# Patient Record
Sex: Female | Born: 1980 | Race: White | Hispanic: No | Marital: Married | Smoking: Never smoker
Health system: Southern US, Community
[De-identification: ages and names within clinical notes are randomized; demographics above are authoritative.]

## PROBLEM LIST (undated history)

## (undated) DIAGNOSIS — I10 Essential (primary) hypertension: Secondary | ICD-10-CM

## (undated) DIAGNOSIS — D72829 Elevated white blood cell count, unspecified: Secondary | ICD-10-CM

## (undated) DIAGNOSIS — E7800 Pure hypercholesterolemia, unspecified: Secondary | ICD-10-CM

## (undated) DIAGNOSIS — R002 Palpitations: Secondary | ICD-10-CM

## (undated) DIAGNOSIS — F411 Generalized anxiety disorder: Secondary | ICD-10-CM

## (undated) DIAGNOSIS — R079 Chest pain, unspecified: Secondary | ICD-10-CM

## (undated) DIAGNOSIS — I498 Other specified cardiac arrhythmias: Secondary | ICD-10-CM

## (undated) DIAGNOSIS — E663 Overweight: Secondary | ICD-10-CM

## (undated) HISTORY — DX: Chest pain, unspecified: R07.9

## (undated) HISTORY — DX: Overweight: E66.3

## (undated) HISTORY — DX: Other specified cardiac arrhythmias: I49.8

## (undated) HISTORY — DX: Palpitations: R00.2

## (undated) HISTORY — DX: Elevated white blood cell count, unspecified: D72.829

## (undated) HISTORY — DX: Generalized anxiety disorder: F41.1

## (undated) HISTORY — DX: Pure hypercholesterolemia, unspecified: E78.00

## (undated) HISTORY — PX: JOINT REPLACEMENT: SHX530

---

## 1999-05-21 ENCOUNTER — Emergency Department (HOSPITAL_COMMUNITY): Admission: EM | Admit: 1999-05-21 | Discharge: 1999-05-21 | Payer: Self-pay | Admitting: Emergency Medicine

## 2000-05-13 ENCOUNTER — Other Ambulatory Visit: Admission: RE | Admit: 2000-05-13 | Discharge: 2000-05-13 | Payer: Self-pay | Admitting: Obstetrics and Gynecology

## 2001-08-03 ENCOUNTER — Other Ambulatory Visit: Admission: RE | Admit: 2001-08-03 | Discharge: 2001-08-03 | Payer: Self-pay | Admitting: Obstetrics and Gynecology

## 2001-08-10 ENCOUNTER — Encounter: Payer: Self-pay | Admitting: Family Medicine

## 2001-08-10 ENCOUNTER — Ambulatory Visit (HOSPITAL_COMMUNITY): Admission: RE | Admit: 2001-08-10 | Discharge: 2001-08-10 | Payer: Self-pay | Admitting: Family Medicine

## 2003-03-29 ENCOUNTER — Other Ambulatory Visit: Admission: RE | Admit: 2003-03-29 | Discharge: 2003-03-29 | Payer: Self-pay | Admitting: Obstetrics and Gynecology

## 2003-08-14 ENCOUNTER — Encounter: Admission: RE | Admit: 2003-08-14 | Discharge: 2003-08-14 | Payer: Self-pay | Admitting: Obstetrics and Gynecology

## 2004-03-17 ENCOUNTER — Emergency Department (HOSPITAL_COMMUNITY): Admission: EM | Admit: 2004-03-17 | Discharge: 2004-03-17 | Payer: Self-pay | Admitting: Emergency Medicine

## 2004-04-14 ENCOUNTER — Ambulatory Visit: Payer: Self-pay | Admitting: Cardiovascular Disease

## 2004-05-15 ENCOUNTER — Ambulatory Visit: Payer: Self-pay | Admitting: Cardiovascular Disease

## 2004-05-15 ENCOUNTER — Ambulatory Visit: Payer: Self-pay

## 2004-10-09 ENCOUNTER — Emergency Department (HOSPITAL_COMMUNITY): Admission: EM | Admit: 2004-10-09 | Discharge: 2004-10-09 | Payer: Self-pay | Admitting: *Deleted

## 2004-10-09 ENCOUNTER — Other Ambulatory Visit: Admission: RE | Admit: 2004-10-09 | Discharge: 2004-10-09 | Payer: Self-pay | Admitting: Obstetrics and Gynecology

## 2004-10-14 ENCOUNTER — Ambulatory Visit (HOSPITAL_COMMUNITY): Admission: RE | Admit: 2004-10-14 | Discharge: 2004-10-14 | Payer: Self-pay | Admitting: Orthopedic Surgery

## 2004-10-27 ENCOUNTER — Encounter: Admission: RE | Admit: 2004-10-27 | Discharge: 2005-01-25 | Payer: Self-pay | Admitting: Specialist

## 2004-11-11 ENCOUNTER — Ambulatory Visit (HOSPITAL_COMMUNITY): Admission: RE | Admit: 2004-11-11 | Discharge: 2004-11-11 | Payer: Self-pay | Admitting: Specialist

## 2004-11-11 ENCOUNTER — Ambulatory Visit (HOSPITAL_BASED_OUTPATIENT_CLINIC_OR_DEPARTMENT_OTHER): Admission: RE | Admit: 2004-11-11 | Discharge: 2004-11-12 | Payer: Self-pay | Admitting: Specialist

## 2004-11-20 ENCOUNTER — Ambulatory Visit: Admission: RE | Admit: 2004-11-20 | Discharge: 2004-11-20 | Payer: Self-pay | Admitting: Specialist

## 2005-01-26 ENCOUNTER — Ambulatory Visit (HOSPITAL_COMMUNITY): Admission: RE | Admit: 2005-01-26 | Discharge: 2005-01-26 | Payer: Self-pay | Admitting: Family Medicine

## 2005-03-29 ENCOUNTER — Emergency Department (HOSPITAL_COMMUNITY): Admission: EM | Admit: 2005-03-29 | Discharge: 2005-03-29 | Payer: Self-pay | Admitting: Emergency Medicine

## 2005-05-04 ENCOUNTER — Ambulatory Visit: Payer: Self-pay | Admitting: Cardiovascular Disease

## 2005-07-13 ENCOUNTER — Ambulatory Visit (HOSPITAL_COMMUNITY): Admission: RE | Admit: 2005-07-13 | Discharge: 2005-07-13 | Payer: Self-pay | Admitting: Family Medicine

## 2005-07-13 ENCOUNTER — Encounter: Payer: Self-pay | Admitting: Vascular Surgery

## 2005-09-09 ENCOUNTER — Ambulatory Visit: Payer: Self-pay | Admitting: Hematology and Oncology

## 2005-09-16 ENCOUNTER — Other Ambulatory Visit: Admission: RE | Admit: 2005-09-16 | Discharge: 2005-09-16 | Payer: Self-pay | Admitting: Hematology and Oncology

## 2005-09-16 ENCOUNTER — Encounter: Payer: Self-pay | Admitting: Hematology and Oncology

## 2005-09-16 LAB — CBC & DIFF AND RETIC
EOS%: 1.5 % (ref 0.0–7.0)
MCH: 32.4 pg (ref 26.0–34.0)
MCHC: 35.3 g/dL (ref 32.0–36.0)
MCV: 91.8 fL (ref 81.0–101.0)
MONO%: 4.9 % (ref 0.0–13.0)
RBC: 3.67 10*6/uL — ABNORMAL LOW (ref 3.70–5.32)
RDW: 12.7 % (ref 11.3–14.5)
RETIC #: 73.4 10*3/uL (ref 19.7–115.1)
Retic %: 2 % (ref 0.4–2.3)

## 2005-09-21 LAB — COMPREHENSIVE METABOLIC PANEL
ALT: 26 U/L (ref 0–40)
Albumin: 4.1 g/dL (ref 3.5–5.2)
BUN: 6 mg/dL (ref 6–23)
CO2: 22 mEq/L (ref 19–32)
Calcium: 9.2 mg/dL (ref 8.4–10.5)
Chloride: 104 mEq/L (ref 96–112)
Creatinine, Ser: 0.67 mg/dL (ref 0.40–1.20)
Potassium: 3.7 mEq/L (ref 3.5–5.3)

## 2005-09-21 LAB — HEPATITIS B CORE ANTIBODY, IGM: Hep B C IgM: NEGATIVE

## 2005-09-21 LAB — ANA: Anti Nuclear Antibody(ANA): NEGATIVE

## 2005-09-21 LAB — PROTEIN ELECTROPHORESIS, SERUM
Alpha-1-Globulin: 5.2 % — ABNORMAL HIGH (ref 2.9–4.9)
Alpha-2-Globulin: 11 % (ref 7.1–11.8)
Total Protein, Serum Electrophoresis: 6.7 g/dL (ref 6.0–8.3)

## 2005-09-21 LAB — HEPATITIS B SURFACE ANTIBODY,QUALITATIVE: Hep B S Ab: POSITIVE — AB

## 2005-09-21 LAB — HEPATITIS A ANTIBODY, TOTAL: Hep A Total Ab: NEGATIVE

## 2005-10-17 ENCOUNTER — Emergency Department (HOSPITAL_COMMUNITY): Admission: EM | Admit: 2005-10-17 | Discharge: 2005-10-17 | Payer: Self-pay | Admitting: Emergency Medicine

## 2005-10-22 ENCOUNTER — Emergency Department (HOSPITAL_COMMUNITY): Admission: EM | Admit: 2005-10-22 | Discharge: 2005-10-22 | Payer: Self-pay | Admitting: Emergency Medicine

## 2005-10-26 ENCOUNTER — Ambulatory Visit: Payer: Self-pay | Admitting: Cardiovascular Disease

## 2005-10-29 ENCOUNTER — Ambulatory Visit: Payer: Self-pay | Admitting: Cardiology

## 2005-10-29 ENCOUNTER — Encounter: Payer: Self-pay | Admitting: Cardiology

## 2005-10-29 ENCOUNTER — Ambulatory Visit (HOSPITAL_COMMUNITY): Admission: RE | Admit: 2005-10-29 | Discharge: 2005-10-29 | Payer: Self-pay | Admitting: Cardiovascular Disease

## 2005-12-11 ENCOUNTER — Inpatient Hospital Stay (HOSPITAL_COMMUNITY): Admission: AD | Admit: 2005-12-11 | Discharge: 2005-12-11 | Payer: Self-pay | Admitting: Obstetrics and Gynecology

## 2005-12-24 ENCOUNTER — Inpatient Hospital Stay (HOSPITAL_COMMUNITY): Admission: AD | Admit: 2005-12-24 | Discharge: 2005-12-24 | Payer: Self-pay | Admitting: Obstetrics and Gynecology

## 2006-01-07 ENCOUNTER — Ambulatory Visit: Payer: Self-pay | Admitting: Cardiovascular Disease

## 2006-01-08 ENCOUNTER — Ambulatory Visit: Payer: Self-pay

## 2006-01-26 ENCOUNTER — Inpatient Hospital Stay (HOSPITAL_COMMUNITY): Admission: AD | Admit: 2006-01-26 | Discharge: 2006-01-26 | Payer: Self-pay | Admitting: Obstetrics and Gynecology

## 2006-02-21 ENCOUNTER — Inpatient Hospital Stay (HOSPITAL_COMMUNITY): Admission: AD | Admit: 2006-02-21 | Discharge: 2006-02-21 | Payer: Self-pay | Admitting: Obstetrics and Gynecology

## 2006-02-22 ENCOUNTER — Inpatient Hospital Stay (HOSPITAL_COMMUNITY): Admission: AD | Admit: 2006-02-22 | Discharge: 2006-02-22 | Payer: Self-pay | Admitting: Obstetrics and Gynecology

## 2006-03-11 ENCOUNTER — Inpatient Hospital Stay (HOSPITAL_COMMUNITY): Admission: RE | Admit: 2006-03-11 | Discharge: 2006-03-14 | Payer: Self-pay | Admitting: Obstetrics and Gynecology

## 2006-04-05 ENCOUNTER — Ambulatory Visit: Payer: Self-pay | Admitting: Cardiovascular Disease

## 2006-04-23 ENCOUNTER — Ambulatory Visit: Payer: Self-pay

## 2006-04-23 ENCOUNTER — Encounter: Payer: Self-pay | Admitting: Cardiology

## 2006-07-07 ENCOUNTER — Ambulatory Visit: Payer: Self-pay | Admitting: Cardiology

## 2006-07-15 ENCOUNTER — Ambulatory Visit: Payer: Self-pay | Admitting: Cardiovascular Disease

## 2006-07-16 ENCOUNTER — Encounter: Payer: Self-pay | Admitting: Cardiovascular Disease

## 2006-07-16 ENCOUNTER — Ambulatory Visit: Payer: Self-pay

## 2006-08-12 ENCOUNTER — Emergency Department (HOSPITAL_COMMUNITY): Admission: EM | Admit: 2006-08-12 | Discharge: 2006-08-12 | Payer: Self-pay | Admitting: *Deleted

## 2006-08-13 ENCOUNTER — Emergency Department (HOSPITAL_COMMUNITY): Admission: EM | Admit: 2006-08-13 | Discharge: 2006-08-13 | Payer: Self-pay | Admitting: Emergency Medicine

## 2006-08-13 ENCOUNTER — Ambulatory Visit: Payer: Self-pay | Admitting: Cardiology

## 2006-08-23 ENCOUNTER — Ambulatory Visit (HOSPITAL_COMMUNITY): Admission: RE | Admit: 2006-08-23 | Discharge: 2006-08-23 | Payer: Self-pay | Admitting: Family Medicine

## 2006-08-25 ENCOUNTER — Ambulatory Visit: Payer: Self-pay | Admitting: Cardiovascular Disease

## 2006-08-31 ENCOUNTER — Emergency Department (HOSPITAL_COMMUNITY): Admission: EM | Admit: 2006-08-31 | Discharge: 2006-08-31 | Payer: Self-pay | Admitting: *Deleted

## 2006-08-31 ENCOUNTER — Ambulatory Visit: Payer: Self-pay | Admitting: Cardiology

## 2006-12-08 ENCOUNTER — Emergency Department (HOSPITAL_COMMUNITY): Admission: EM | Admit: 2006-12-08 | Discharge: 2006-12-08 | Payer: Self-pay | Admitting: Emergency Medicine

## 2007-06-27 ENCOUNTER — Encounter (INDEPENDENT_AMBULATORY_CARE_PROVIDER_SITE_OTHER): Payer: Self-pay | Admitting: Emergency Medicine

## 2007-06-27 ENCOUNTER — Emergency Department (HOSPITAL_COMMUNITY): Admission: EM | Admit: 2007-06-27 | Discharge: 2007-06-27 | Payer: Self-pay | Admitting: Emergency Medicine

## 2007-06-27 ENCOUNTER — Ambulatory Visit: Payer: Self-pay | Admitting: Vascular Surgery

## 2007-06-30 ENCOUNTER — Emergency Department (HOSPITAL_COMMUNITY): Admission: EM | Admit: 2007-06-30 | Discharge: 2007-07-01 | Payer: Self-pay | Admitting: Emergency Medicine

## 2008-02-13 ENCOUNTER — Emergency Department (HOSPITAL_COMMUNITY): Admission: EM | Admit: 2008-02-13 | Discharge: 2008-02-13 | Payer: Self-pay | Admitting: Emergency Medicine

## 2008-03-08 ENCOUNTER — Inpatient Hospital Stay (HOSPITAL_COMMUNITY): Admission: RE | Admit: 2008-03-08 | Discharge: 2008-03-10 | Payer: Self-pay | Admitting: Obstetrics and Gynecology

## 2008-04-27 ENCOUNTER — Telehealth: Payer: Self-pay | Admitting: Cardiovascular Disease

## 2008-05-07 DIAGNOSIS — I498 Other specified cardiac arrhythmias: Secondary | ICD-10-CM

## 2008-05-07 DIAGNOSIS — F411 Generalized anxiety disorder: Secondary | ICD-10-CM | POA: Insufficient documentation

## 2008-05-07 DIAGNOSIS — R079 Chest pain, unspecified: Secondary | ICD-10-CM | POA: Insufficient documentation

## 2008-05-07 DIAGNOSIS — R002 Palpitations: Secondary | ICD-10-CM

## 2008-05-07 DIAGNOSIS — D72829 Elevated white blood cell count, unspecified: Secondary | ICD-10-CM | POA: Insufficient documentation

## 2008-05-07 DIAGNOSIS — E7800 Pure hypercholesterolemia, unspecified: Secondary | ICD-10-CM | POA: Insufficient documentation

## 2008-07-03 ENCOUNTER — Telehealth: Payer: Self-pay | Admitting: Cardiovascular Disease

## 2008-07-03 ENCOUNTER — Emergency Department (HOSPITAL_COMMUNITY): Admission: EM | Admit: 2008-07-03 | Discharge: 2008-07-03 | Payer: Self-pay | Admitting: Emergency Medicine

## 2008-07-20 ENCOUNTER — Telehealth: Payer: Self-pay | Admitting: Cardiovascular Disease

## 2008-07-23 ENCOUNTER — Ambulatory Visit: Payer: Self-pay | Admitting: Cardiovascular Disease

## 2008-07-23 DIAGNOSIS — I10 Essential (primary) hypertension: Secondary | ICD-10-CM

## 2008-09-11 ENCOUNTER — Ambulatory Visit: Payer: Self-pay

## 2008-09-11 ENCOUNTER — Encounter: Payer: Self-pay | Admitting: Cardiovascular Disease

## 2008-09-23 ENCOUNTER — Emergency Department (HOSPITAL_COMMUNITY): Admission: EM | Admit: 2008-09-23 | Discharge: 2008-09-23 | Payer: Self-pay | Admitting: Emergency Medicine

## 2008-09-29 ENCOUNTER — Emergency Department (HOSPITAL_COMMUNITY): Admission: EM | Admit: 2008-09-29 | Discharge: 2008-09-29 | Payer: Self-pay | Admitting: Emergency Medicine

## 2008-09-30 ENCOUNTER — Encounter (INDEPENDENT_AMBULATORY_CARE_PROVIDER_SITE_OTHER): Payer: Self-pay | Admitting: Emergency Medicine

## 2008-09-30 ENCOUNTER — Ambulatory Visit: Payer: Self-pay | Admitting: Vascular Surgery

## 2008-09-30 ENCOUNTER — Emergency Department (HOSPITAL_COMMUNITY): Admission: EM | Admit: 2008-09-30 | Discharge: 2008-09-30 | Payer: Self-pay | Admitting: Emergency Medicine

## 2008-12-11 ENCOUNTER — Emergency Department (HOSPITAL_COMMUNITY): Admission: EM | Admit: 2008-12-11 | Discharge: 2008-12-12 | Payer: Self-pay | Admitting: Emergency Medicine

## 2008-12-31 ENCOUNTER — Telehealth: Payer: Self-pay | Admitting: Cardiovascular Disease

## 2009-01-01 ENCOUNTER — Ambulatory Visit: Payer: Self-pay | Admitting: Cardiovascular Disease

## 2009-01-01 DIAGNOSIS — E663 Overweight: Secondary | ICD-10-CM | POA: Insufficient documentation

## 2009-03-01 ENCOUNTER — Encounter: Payer: Self-pay | Admitting: Cardiovascular Disease

## 2009-03-04 ENCOUNTER — Telehealth: Payer: Self-pay | Admitting: Cardiovascular Disease

## 2009-04-02 ENCOUNTER — Emergency Department (HOSPITAL_BASED_OUTPATIENT_CLINIC_OR_DEPARTMENT_OTHER): Admission: EM | Admit: 2009-04-02 | Discharge: 2009-04-02 | Payer: Self-pay | Admitting: Emergency Medicine

## 2009-04-02 ENCOUNTER — Ambulatory Visit: Payer: Self-pay | Admitting: Radiology

## 2010-01-26 ENCOUNTER — Encounter: Payer: Self-pay | Admitting: Cardiovascular Disease

## 2010-01-26 ENCOUNTER — Encounter: Payer: Self-pay | Admitting: Emergency Medicine

## 2010-01-26 ENCOUNTER — Encounter: Payer: Self-pay | Admitting: Family Medicine

## 2010-02-04 NOTE — Progress Notes (Signed)
Summary: **Look for EKG** QUESTION ABOUT EKG  Phone Note Call from Patient Call back at Home Phone [phone]   Caller: Patient Summary of Call: PT HAVE QUESTION ABOUT A EKG. Initial call taken by: Judie Grieve,  March 04, 2009 9:18 AM  Follow-up for Phone Call        Pt went to [facility] with CP and had an EKG done in the ER. She said she had a bad experience and they were super busy and she feels they let her go and maybe should not have. She said the MD there told her that her T waves were "flipped." He then told her all of the bad things that could be causing it and left and the PA came and discharged her and said that the EKG looked fine to her. They told her to follow up with her doctor but did not say which doctor. She wants to have them to fax her EKG over so that we can compare it to her last one to see if it is urgent that she be seen. I told her that Dr. Eden Emms was not here today but I would certainly send it to him and Stanton Kidney for f/u. I also told her I could take it to the DOD and get his opinion on the urgency. She will try to have it faxed.  Follow-up by: Duncan Dull, RN, BSN,  March 04, 2009 10:23 AM  Additional Follow-up for Phone Call Additional follow up Details #1::        pt would like to speak to nurse again, Migdalia Dk  March 04, 2009 10:28 AM   Pt wanted me to call and request EKG from [facility]. I advised her that she would need to go to [facility] and sign a release form and have them fax it to [place] or that she could bring it by. Pt understands and will take care of getting [place] the EKG.  Additional Follow-up by: Duncan Dull, RN, BSN,  March 04, 2009 2:24 PM     Appended Document: **Look for EKG** QUESTION ABOUT EKG Her ECG here from 12/2008 had non-specific ST/T wave changes not "flipped" T;s.  Will compare to ECG from [facility] when available  Appended Document: **Look for EKG** QUESTION ABOUT EKG spoke with pt, EKG's requested from  [facility].  Appended Document: **Look for EKG** QUESTION ABOUT EKG EKG reviewed by Dr. Eden Emms. Per Dr. Eden Emms EKG looks fine and looks better than previous EKG. Pt notified

## 2010-03-05 ENCOUNTER — Encounter (HOSPITAL_COMMUNITY)
Admission: RE | Admit: 2010-03-05 | Discharge: 2010-03-05 | Disposition: A | Discharge status: Home / Self Care | Payer: PRIVATE HEALTH INSURANCE | Source: Ambulatory Visit | Attending: Obstetrics and Gynecology | Admitting: Obstetrics and Gynecology

## 2010-03-05 LAB — CBC
MCH: 27.8 pg (ref 26.0–34.0)
MCHC: 32.8 g/dL (ref 30.0–36.0)
MCV: 84.6 fL (ref 78.0–100.0)
Platelets: 280 10*3/uL (ref 150–400)
RBC: 3.89 MIL/uL (ref 3.87–5.11)
RDW: 13.9 % (ref 11.5–15.5)
WBC: 10.9 10*3/uL — ABNORMAL HIGH (ref 4.0–10.5)

## 2010-03-05 LAB — SURGICAL PCR SCREEN: Staphylococcus aureus: NEGATIVE

## 2010-03-10 ENCOUNTER — Inpatient Hospital Stay (HOSPITAL_COMMUNITY)
Admission: RE | Admit: 2010-03-10 | Discharge: 2010-03-13 | DRG: 766 | Disposition: A | Discharge status: Home / Self Care | Payer: PRIVATE HEALTH INSURANCE | Source: Ambulatory Visit | Attending: Obstetrics and Gynecology | Admitting: Obstetrics and Gynecology

## 2010-03-10 DIAGNOSIS — O34219 Maternal care for unspecified type scar from previous cesarean delivery: Principal | ICD-10-CM | POA: Diagnosis present

## 2010-03-10 DIAGNOSIS — Z01818 Encounter for other preprocedural examination: Secondary | ICD-10-CM

## 2010-03-10 DIAGNOSIS — Z01812 Encounter for preprocedural laboratory examination: Secondary | ICD-10-CM

## 2010-03-11 LAB — CBC
HCT: 28.9 % — ABNORMAL LOW (ref 36.0–46.0)
Hemoglobin: 9.2 g/dL — ABNORMAL LOW (ref 12.0–15.0)
MCHC: 31.8 g/dL (ref 30.0–36.0)
Platelets: 278 10*3/uL (ref 150–400)
RBC: 3.38 MIL/uL — ABNORMAL LOW (ref 3.87–5.11)
RDW: 14.4 % (ref 11.5–15.5)
WBC: 12.3 10*3/uL — ABNORMAL HIGH (ref 4.0–10.5)

## 2010-03-11 LAB — TYPE AND SCREEN
ABO/RH(D): O NEG
DAT, IgG: NEGATIVE

## 2010-03-11 LAB — CCBB MATERNAL DONOR DRAW

## 2010-03-28 NOTE — Discharge Summary (Signed)
  Annette Hoffman, BURESH      ACCOUNT NO.:  [id number]  MEDICAL RECORD NO.:  [id number]           PATIENT TYPE:  I  LOCATION:  9147                          [facility]  [facility]  PHYSICIAN:  Zenaida Niece, M.D.DATE OF BIRTH:  May 26, 1980  DATE OF ADMISSION:  03/10/2010 DATE OF DISCHARGE:  03/13/2010                              DISCHARGE SUMMARY   ADMISSION DIAGNOSES: 1. Intrauterine pregnancy at 39 weeks. 2. Previous cesarean section x2.  DISCHARGE DIAGNOSES: 1. Intrauterine pregnancy at 39 weeks. 2. Previous cesarean section x2.  PROCEDURES:  On March 10, 2010, she underwent repeat low transverse cesarean section.  HISTORY AND PHYSICAL:  Please see chart for full history and physical. Briefly, this is a 30 year old gravida 3, para 2-0-0-2 with an EGA of [redacted] weeks, who was admitted for repeat cesarean section with 2 prior cesarean sections.  Prenatal care is complicated by multiple complaints including palpitation, shortness of breath, and anxiety.  PAST HISTORY:  Significant for 2 low-transverse cesarean sections, iron- deficiency anemia, anxiety, and SVT.  SIGNIFICANT PHYSICAL EXAMINATION:  ABDOMEN:  Gravid, nontender with fundal height of 39 cm and well-healed transverse scar.  Cervix is closed 30, -3, vertex presentation.  HOSPITAL COURSE:  The patient was admitted and taken to the operating room on March 10, 2010, where she underwent repeat low transverse cesarean section under spinal anesthesia.  She had normal anatomy and delivered a viable female with Apgars of 9 and 9, weight 8 pounds and 13 ounces.  Estimated blood loss was 800 mL.  Postoperatively, she had no significant complications.  Preoperative hemoglobin was 10.8, postop was 9.2.  She did have intermittent vaginal gushes of blood but again remained hemodynamically stable and hemoglobin dropped appropriately. She remained afebrile, was able to ambulate on postoperative day #3, and was felt to be stable  for discharge home.  At that time, her incision was healing well and staples were left intact.  DISCHARGE INSTRUCTIONS:  Regular diet, pelvic rest, no strenuous activity.  Followup is in 3-4 days for staple removal.  She was given our discharge pamphlet.  MEDICATIONS: 1. Percocet #40, 1-2 p.o. q.4-6 hours p.r.n. pain. 2. Over-the-counter ibuprofen as needed.     Zenaida Niece, M.D.     TDM/MEDQ  D:  03/13/2010  T:  03/13/2010  Job:  [id number]  Electronically Signed by Lavina Hamman M.D. on 03/28/2010 08:53:13 AM

## 2010-03-28 NOTE — Op Note (Signed)
NAME:  Annette Hoffman, Annette Hoffman      ACCOUNT NO.:  [id number]  MEDICAL RECORD NO.:  [id number]           PATIENT TYPE:  I  LOCATION:  9147                          [facility]  PHYSICIAN:  Leighton Roach Carolyne Whitsel, M.D.DATE OF BIRTH:  1980-06-25  DATE OF PROCEDURE:  03/10/2010 DATE OF DISCHARGE:                              OPERATIVE REPORT   PREOPERATIVE DIAGNOSES:  Intrauterine pregnancy at 39 weeks, previous cesarean section x2.  POSTOPERATIVE DIAGNOSES:  Intrauterine pregnancy at 39 weeks, previous cesarean section x2.  PROCEDURE:  Repeat low transverse cesarean section.  SURGEON:  Zenaida Niece, MD  ASSISTANT:  Tracey Harries, MD  ANESTHESIA:  Spinal.  FINDINGS:  The patient had normal gravid anatomy.  She delivered a viable female infant with Apgars of 9 and 9, weight 8 pounds 13 ounces.  SPECIMENS:  Placenta sent to Labor and Delivery.  ESTIMATED BLOOD LOSS:  800 mL.  COMPLICATIONS:  None.  PROCEDURE IN DETAIL:  The patient was taken to the operating room and placed in the sitting position.  Dr. Malen Gauze instilled spinal anesthesia. She was then placed in the dorsal supine position with left lateral tilt.  Abdomen was prepped and draped in the usual sterile fashion and a Foley catheter was inserted.  The level of her anesthesia was found to be adequate.  Abdomen was entered via a standard Pfannenstiel incision through her previous scar.  The Alexis disposable self-retaining retractor was placed and good visualization was achieved.  A 4-cm transverse incision was made in the lower uterine segment pushing the bladder inferior.  Once the uterine cavity was entered, the incision was extended digitally.  Membranes were ruptured revealing clear fluid.  A fetal arm was at the incision.  It was placed back into the uterine incision.  The fetal vertex was in the pelvis and was grasped and delivered through the incision atraumatically.  Mouth and nares were suctioned.  The  remainder of the infant then delivered atraumatically. Cord was doubly clamped and cut and the infant handed to the awaiting pediatric team.  Placenta was partially spontaneously and partially manually delivered and trailing membranes were removed as best as possible.  Placenta was sent for cord blood donation.  Uterus was wiped dry with a clean lap pad and all clots and debris removed.  Uterine incision was inspected and found to be free of extensions.  Uterine incision was then closed in one layer being running locking layer with #1 chromic with adequate closure and adequate hemostasis.  A small amount of bleeding from the right angle was controlled with a figure-of- eight suture of #1 chromic.  Bleeding from serosal edges was controlled with electrocautery.  Tubes and ovaries were inspected and found to be normal.  Uterine incision was again inspected and found to be hemostatic.  The Alexis retractor was removed.  Subfascial space was irrigated and made hemostatic with electrocautery.  Rectus muscles and peritoneum were reapproximated in the midline with interrupted sutures of #1 chromic due to a diastasis.  Fascia was closed in running fashion starting at both ends and meeting in the middle with 0 Vicryl. Subcutaneous tissue was irrigated and made hemostatic with electrocautery.  Subcutaneous  tissue was closed with a running 2-0 plain gut suture.  Skin was closed with staples followed by a sterile dressing.  The patient tolerated the procedure well and was taken to the recovery room in stable condition.  Counts were correct x2, she received Ancef 2 grams IV at the beginning of the procedure and had PAS hose on throughout the procedure.     Zenaida Niece, M.D.     TDM/MEDQ  D:  03/10/2010  T:  03/10/2010  Job:  [id number]  Electronically Signed by Lavina Hamman M.D. on 03/28/2010 08:53:16 AM

## 2010-03-28 NOTE — H&P (Signed)
  Annette Hoffman, Annette Hoffman NO.:  [id number]  MEDICAL RECORD NO.:  [id number]           PATIENT TYPE:  LOCATION:                                 FACILITY:  PHYSICIAN:  Zenaida Niece, M.D.DATE OF BIRTH:  22-Dec-1980  DATE OF ADMISSION:  03/10/2010 DATE OF DISCHARGE:                             HISTORY & PHYSICAL   CHIEF COMPLAINT:  Repeat cesarean section.  HISTORY OF PRESENT ILLNESS:  This is a 30 year old gravida 3, para 2-0-0- 2 with an EGA of [redacted] weeks by an 8-week ultrasound with a due date of March 10 who is admitted for repeat cesarean section.  The patient has had 2 prior cesarean sections and wishes to have a cesarean section with this delivery as well.  Prenatal care has been essentially uncomplicated.  The patient has multiple complaints at different times including palpitations, shortness of breath, and anxiety but no significant problems during her pregnancy.  SIGNIFICANT PRENATAL LABS:  Blood type is O negative with a negative antibody screen.  Rubella immune, hepatitis B surface antigen negative. First trimester screen is normal, GCT is 118.  Group B strep is negative.  PAST OB HISTORY:  In 2008 low transverse cesarean section at 40 weeks for arrest of dilation, female infant weight 7 pounds 7 ounces.  In 2010 repeat low transverse cesarean section at 39 weeks, female infant, weighed 8 pounds 7 ounces.  PAST MEDICAL HISTORY:  Significant for iron-deficiency anemia, anxiety, leukocytosis during the 2008 pregnancy, pregnancy-induced hypertension, SVT for which she has been on Toprol in the past.  PAST SURGICAL HISTORY:  Cesarean section x2, ear surgery, knee surgery, wisdom tooth extraction.  ALLERGIES:  ADHESIVE, BEE STINGS, CHLORHEXIDINE, and PENICILLINS.  CURRENT MEDICATIONS:  Protonix for reflux.  FAMILY HISTORY:  No GYN or colon cancer.  SOCIAL HISTORY:  She is married and denies alcohol, tobacco, or drug use.  She is unemployed, a  respiratory therapist at [employer].  REVIEW OF SYSTEMS:  Normal complaints of pregnancy.  PHYSICAL EXAMINATION:  GENERAL:  This is a well-developed gravid female in no acute distress. VITAL SIGNS:  Weight is 270 pounds. NECK:  Supple without lymphadenopathy or thyromegaly. LUNGS:  Clear to auscultation. HEART:  Regular rate and rhythm without murmur. ABDOMEN:  Gravid, nontender with fundal height of 39 cm and a well- healed transverse scar. EXTREMITIES:  Have 1+ edema. PELVIC:  Cervix is closed, 30, -3 with a vertex presentation.  ASSESSMENT:  Intrauterine pregnancy at 39 weeks with 2 prior cesarean sections.  The patient desires repeat cesarean section.  The procedure and all risks have been discussed with the patient and she agrees to proceed.  Plan is to admit the patient on March 5 for repeat cesarean section.  She is considering tubal ligation and will let me know prior to the actual C-section if she wants to proceed with this.     Zenaida Niece, M.D.     TDM/MEDQ  D:  03/09/2010  T:  03/09/2010  Job:  [id number]  Electronically Signed by Lavina Hamman M.D. on 03/28/2010 08:53:19 AM

## 2010-03-31 LAB — DIFFERENTIAL
Basophils Absolute: 0 10*3/uL (ref 0.0–0.1)
Basophils Relative: 0 % (ref 0–1)
Eosinophils Absolute: 0.4 10*3/uL (ref 0.0–0.7)
Monocytes Absolute: 0.5 10*3/uL (ref 0.1–1.0)
Monocytes Relative: 5 % (ref 3–12)
Neutro Abs: 7.1 10*3/uL (ref 1.7–7.7)
Neutrophils Relative %: 71 % (ref 43–77)

## 2010-03-31 LAB — CBC
Hemoglobin: 12.4 g/dL (ref 12.0–15.0)
MCHC: 35.6 g/dL (ref 30.0–36.0)
MCV: 86 fL (ref 78.0–100.0)
RDW: 12.2 % (ref 11.5–15.5)

## 2010-04-08 LAB — POCT CARDIAC MARKERS
CKMB, poc: 1.1 ng/mL (ref 1.0–8.0)
Myoglobin, poc: 74.5 ng/mL (ref 12–200)
Troponin i, poc: <0.05 ng/mL (ref 0.00–0.09)

## 2010-04-08 LAB — BASIC METABOLIC PANEL
CO2: 25 mEq/L (ref 19–32)
Calcium: 8.8 mg/dL (ref 8.4–10.5)
Creatinine, Ser: 0.96 mg/dL (ref 0.4–1.2)
GFR calc non Af Amer: >60 mL/min (ref >60)
Glucose, Bld: 131 mg/dL — ABNORMAL HIGH (ref 70–99)
Sodium: 136 mEq/L (ref 135–145)

## 2010-04-08 LAB — DIFFERENTIAL
Basophils Absolute: 0 10*3/uL (ref 0.0–0.1)
Basophils Relative: 0 % (ref 0–1)
Lymphocytes Relative: 29 % (ref 12–46)
Monocytes Absolute: 0.6 10*3/uL (ref 0.1–1.0)
Neutro Abs: 6.5 10*3/uL (ref 1.7–7.7)
Neutrophils Relative %: 62 % (ref 43–77)

## 2010-04-08 LAB — CBC
Hemoglobin: 12.9 g/dL (ref 12.0–15.0)
MCHC: 35.2 g/dL (ref 30.0–36.0)
Platelets: 334 10*3/uL (ref 150–400)
RDW: 12.6 % (ref 11.5–15.5)

## 2010-04-11 LAB — URINALYSIS, ROUTINE W REFLEX MICROSCOPIC
Glucose, UA: NEGATIVE mg/dL
Hgb urine dipstick: NEGATIVE
Ketones, ur: NEGATIVE mg/dL
Protein, ur: NEGATIVE mg/dL
Urobilinogen, UA: 1 mg/dL (ref 0.0–1.0)

## 2010-04-11 LAB — DIFFERENTIAL
Basophils Absolute: 0.1 10*3/uL (ref 0.0–0.1)
Basophils Relative: 1 % (ref 0–1)
Lymphocytes Relative: 29 % (ref 12–46)
Monocytes Absolute: 0.8 10*3/uL (ref 0.1–1.0)
Neutro Abs: 4.8 10*3/uL (ref 1.7–7.7)

## 2010-04-11 LAB — CBC
Hemoglobin: 13.1 g/dL (ref 12.0–15.0)
MCHC: 34.8 g/dL (ref 30.0–36.0)
Platelets: 352 10*3/uL (ref 150–400)
RDW: 12.7 % (ref 11.5–15.5)

## 2010-04-11 LAB — D-DIMER, QUANTITATIVE: D-Dimer, Quant: <0.22 ug/mL-FEU (ref 0.00–0.48)

## 2010-04-14 LAB — POCT CARDIAC MARKERS
CKMB, poc: <1 ng/mL — ABNORMAL LOW (ref 1.0–8.0)
Troponin i, poc: <0.05 ng/mL (ref 0.00–0.09)

## 2010-04-17 LAB — CBC
HCT: 27 % — ABNORMAL LOW (ref 36.0–46.0)
HCT: 31.2 % — ABNORMAL LOW (ref 36.0–46.0)
Hemoglobin: 10.5 g/dL — ABNORMAL LOW (ref 12.0–15.0)
Hemoglobin: 9.1 g/dL — ABNORMAL LOW (ref 12.0–15.0)
MCV: 84.3 fL (ref 78.0–100.0)
RBC: 3.2 MIL/uL — ABNORMAL LOW (ref 3.87–5.11)
RBC: 3.69 MIL/uL — ABNORMAL LOW (ref 3.87–5.11)
WBC: 12.3 10*3/uL — ABNORMAL HIGH (ref 4.0–10.5)

## 2010-04-17 LAB — RH IMMUNE GLOB WKUP(>/=20WKS)(NOT WOMEN'S HOSP)

## 2010-04-17 LAB — COMPREHENSIVE METABOLIC PANEL
ALT: 9 U/L (ref 0–35)
Alkaline Phosphatase: 129 U/L — ABNORMAL HIGH (ref 39–117)
BUN: 3 mg/dL — ABNORMAL LOW (ref 6–23)
CO2: 22 mEq/L (ref 19–32)
GFR calc non Af Amer: >60 mL/min (ref >60)
Glucose, Bld: 109 mg/dL — ABNORMAL HIGH (ref 70–99)
Potassium: 3.1 mEq/L — ABNORMAL LOW (ref 3.5–5.1)
Sodium: 132 mEq/L — ABNORMAL LOW (ref 135–145)
Total Bilirubin: 0.5 mg/dL (ref 0.3–1.2)

## 2010-04-17 LAB — RPR: RPR Ser Ql: NONREACTIVE

## 2010-04-22 LAB — RAPID URINE DRUG SCREEN, HOSP PERFORMED
Opiates: POSITIVE — AB
Tetrahydrocannabinol: NOT DETECTED

## 2010-04-22 LAB — URINE MICROSCOPIC-ADD ON

## 2010-04-22 LAB — URINALYSIS, ROUTINE W REFLEX MICROSCOPIC
Glucose, UA: 100 mg/dL — AB
Hgb urine dipstick: NEGATIVE
Ketones, ur: NEGATIVE mg/dL
pH: 7 (ref 5.0–8.0)

## 2010-05-20 NOTE — Op Note (Signed)
NAMEMarland Kitchen  Annette, Hoffman NO.:  [id number]   MEDICAL RECORD NO.:  [id number]          PATIENT TYPE:  INP   LOCATION:  9115                          [facility]  [facility]   PHYSICIAN:  Zenaida Niece, M.D.DATE OF BIRTH:  27-Aug-1980   DATE OF PROCEDURE:  03/08/2008  DATE OF DISCHARGE:                               OPERATIVE REPORT   PREOPERATIVE DIAGNOSES:  1. Intrauterine pregnancy at 39 weeks.  2. Previous cesarean section.   POSTOPERATIVE DIAGNOSES:  1. Intrauterine pregnancy at 39 weeks.  2. Previous cesarean section.   PROCEDURES:  Repeat low transverse cesarean section.   SURGEON:  Zenaida Niece, MD   ASSISTANT:  Sherron Monday, MD   ANESTHESIA:  Spinal.   FINDINGS:  The patient had normal gravid anatomy and delivered a viable  female infant with Apgars of 9 and 9, weight 8 pounds 7 ounces.   SPECIMENS:  Placenta sent to Labor and Delivery.   ESTIMATED BLOOD LOSS:  1000 mL.   COMPLICATIONS:  None.   PROCEDURE IN DETAIL:  The patient was taken to the operating room and  placed in the sitting position.  Dr. Arby Barrette instilled spinal  anesthesia and she was then placed in the dorsal supine position with  left lateral tilt.  Abdomen was prepped and draped in the usual sterile  fashion and a Foley catheter was inserted.  The level of her anesthesia  was found to be adequate.  Abdomen was entered via a standard  Pfannenstiel incision through her previous scar.  Once the peritoneal  cavity was entered, the Alexis disposable self-retaining retractor was  placed with good exposure of the lower uterine segment.  A 4-cm  transverse incision was then made in the lower uterine segment pushing  the bladder inferior.  The uterine cavity was then entered bluntly and  extended digitally.  The fetal vertex was grasped and delivered through  the incision atraumatically.  Mouth and nares were suctioned.  An occult  cord came out with the baby.  The remainder of the infant  then delivered  atraumatically.  Cord was doubly clamped and cut and the infant handed  to the awaiting pediatric team.  There was noted to be a true knot in  the umbilical cord.  Cord blood was obtained.  The placenta then  delivered spontaneously.  Uterus was wiped dry with a clean lap pad and  all clots and debris were removed.  The uterine incision was inspected  and found to be free of extensions.  Uterine incision was closed in 1  layer being a running locking layer with #1 chromic.  Small amount of  bleeding from the middle of the incision was controlled with 1 figure-of-  eight suture of #1 chromic.  Bleeding from serosal edges was controlled  with electrocautery.  Tubes and ovaries were inspected and found to be  normal.  Uterine incision was again inspected and found to be  hemostatic.  The Alexis retractor was removed.  The subfascial space was  irrigated and made hemostatic with electrocautery.  The fascia was  closed in running fashion starting at both ends and meeting in the  middle with 0 Vicryl.  Subcutaneous tissue was then irrigated and made  hemostatic.  Subcutaneous tissue was closed with running 2-0 plain gut  suture.  Skin was then closed with staples followed by a sterile  dressing.  The patient tolerated the procedure well and was taken to the  recovery room in stable condition.  Counts were correct x2, she received  Ancef 2 g IV at the beginning of the procedure, and had PAS hose on  throughout the procedure.      Zenaida Niece, M.D.  Electronically Signed     TDM/MEDQ  D:  03/08/2008  T:  03/08/2008  Job:  [id number]

## 2010-05-20 NOTE — Assessment & Plan Note (Signed)
[facility]                                 ON-CALL NOTE   LAILYN, APPELBAUM               MRN:          [id number]  DATE:08/25/2006                            DOB:          01-13-1980    PRIMARY CARDIOLOGIST:  [name]. Eden Emms, M.D., [place].   SUMMARY/HISTORY:  [name] is a 30 year old female who calls  this morning complaining of chest discomfort.  She states it has been  constant for the last 2 weeks and has been waxing and waning.  She is  not sure what precipitates or alleviates the discomfort.  She describes  it as sharp in her left chest.  She has also noticed some left arm  tingling.  She states that she has had this discomfort for at least  since the age of 89.   On reviewing E-chart, she also has a history of palpitations without  documented dysrhythmia on Holter monitor.  Her last echocardiogram on  July 16, 2006 shows an ejection fraction at 55% without wall motion  abnormalities.  According to E-chart and the patient, it does not appear  that she has ever had a stress test or a cardiac catheterization.  I  explained to Ms. Sola-Bradford the only way to evaluate her at 6:45 in  the morning would be for her to come to the emergency room.  I reassured  her, given the information provided, that this is probably not related  to a circulation problem to her heart muscle, given the duration of the  discomfort, as well as her history.  I explained to her that, if she  wished to be evaluated, we would be happy to see her in [facility] emergency  room, or I could leave a message at the office in the hopes that they  would be able to see her today or sooner than her prior scheduled  appointment with Dr. Eden Emms on the 27th.  She elected to wait until the  office opens to see if she could be seen for further evaluation.  I  explained to her that I would leave a message on the office voice mail  with regard to her future call.  I also  explained to her that, if she  decided to come to the emergency room, we would be happy to evaluate  her, and to please let us know so that we could notify the emergency  room of her pending admission.      Joellyn Rued, PA-C  Electronically Signed      Bevelyn Buckles. Bensimhon, MD  Electronically Signed   EW/MedQ  DD: 08/25/2006  DT: 08/25/2006  Job #: [id number]

## 2010-05-20 NOTE — H&P (Signed)
NAME:  Annette Hoffman, MAYR NO.:  [id number]   MEDICAL RECORD NO.:  [id number]           PATIENT TYPE:   LOCATION:                                 FACILITY:   PHYSICIAN:  Zenaida Niece, M.D.DATE OF BIRTH:  18-Nov-1980   DATE OF ADMISSION:  03/08/2008  DATE OF DISCHARGE:                              HISTORY & PHYSICAL   CHIEF COMPLAINT:  Repeat cesarean section.   HISTORY OF PRESENT ILLNESS:  This is a 30 year old female gravida 2,  para 1-0-0-1 with an EGA of [redacted] weeks by an LMP consistent with an 8 week  ultrasound with a due date of March 11 who is admitted for repeat  cesarean section.  With her first pregnancy she had a low transverse  cesarean section done for PIH, leukocytosis and arrest of dilation.  She  delivered a viable infant female that weighed 7 pounds 7 ounces.  She is  cleared for a trial of labor but declines this and thus is being  admitted for repeat cesarean section.  Prenatal care has been  complicated by episodes of nausea and vomiting and episodes of  palpitations and shortness of breath as well as anxiety but no  significant complications. The patient on February 9 had had a  persistent headache for 3 weeks and had a CT scan of her head which she  was told showed possible sinusitis.  She was treated with Keflex 1500 mg  b.i.d. for 10 days and headaches did improve somewhat and she tolerated  this well.   PRENATAL LABS:  RPR nonreactive, hepatitis B surface antigen negative,  rubella immune, HIV negative, blood type is O negative with a negative  antibody screen.  Gonorrhea and chlamydia are negative.  Cystic fibrosis  negative.  First trimester screen is normal.  MSAFP is normal.  One hour  Glucola 108.  Repeat RPR and HIV were negative.  Group B strep is  negative.  Ultrasound did reveal an echogenic intracardiac focus on the  fetus but otherwise normal.   PAST OB HISTORY:  Again, in 2008 low transverse cesarean section done  for PIH and  arrest of dilation, viable female 7 pounds 7 ounces.   PAST GYN HISTORY:  No history of abnormal Pap smears.   PAST MEDICAL HISTORY:  History of supraventricular tachycardia,  unexplained leukocytosis, anxiety.   PAST SURGICAL HISTORY:  Significant for the cesarean section, ear  surgery, knee surgery and wisdom tooth removal.   ALLERGIES:  PENICILLIN which causes a rash.  SULFA which causes hives  and ADHESIVE.  She has tolerated cephalosporins in the past.   CURRENT MEDICATIONS:  None.   SOCIAL HISTORY:  She is married and quit smoking during pregnancy.   FAMILY HISTORY:  Is noncontributory.   PHYSICAL EXAM:  GENERAL:  Generally this is a well-developed gravid  female in no acute distress.  VITAL SIGNS:  Weight is 263 pounds, blood pressure is 144/86.  NECK:  Is supple without lymphadenopathy or thyromegaly.  LUNGS:  Lungs are clear to auscultation.  HEART:  Heart is regular rate and rhythm without murmur.  ABDOMEN:  Abdomen is gravid, nontender with a fundal  height of 39.5 cm  and she has a well-healed transverse scar.  EXTREMITIES:  Extremities have no edema and are nontender.  CERVIX:  Cervix is fingertip, 30 and high with a vertex presentation.   ASSESSMENT:  1. Intrauterine pregnancy at 39 weeks.  2. Prior cesarean section.  The patient is cleared for a trial of      labor but declines this and wants repeat cesarean section.  All      risks of surgery have been discussed.   PLAN:  Is to admit the patient on March 4 for repeat cesarean section.      Zenaida Niece, M.D.  Electronically Signed     TDM/MEDQ  D:  03/07/2008  T:  03/07/2008  Job:  [id number]

## 2010-05-20 NOTE — Assessment & Plan Note (Signed)
[facility]                                 ON-CALL NOTE   [name], [name]             MRN:          [id number]  DATE:07/06/2006                            DOB:          Apr 04, 1980    TELEPHONE CONTACT NOTE   PRIMARY CARDIOLOGIST:  Dr. Charlton Haws.   [name] called this morning because she stated she had gotten up this  morning and was having some chest tightness.  She stated that she was  not having any shortness of breath.  She took a blood pressure and 1  reading was 150/105 and then she took it again a few minutes later, and  it was 140/90.  She has been on Lasix and Toprol in the past, but does  not have any of those medications right now.  She stated that she was  tired from having gone back to work and being up a lot with the baby,  but felt like she was basically doing okay.  She also complained of  having some chills, but stated that she had taken her temperature and  she was 97.6 and not febrile.   I advised [name] that the only way to be evaluated right now was to  come to the emergency room.  I advised her that, if she wanted to take  some Tylenol and rest for a little while, I would have the office  contact her for a followup appointment.  I requested that she get  followup with either her primary care physician or [place] because it sounded  like her baseline blood pressure runs a bit high and she should be on  some medications.  I advised her that, if her symptoms worsen, she  should call 911 and come to the emergency room.  She stated she would  contact her husband and decide if she wanted to come to the emergency  room now or wait for [place] to call.  I advised her that, if she was going  to come to the emergency room, she should not drive herself.  Of note,  she stated that the symptoms she was having today were similar to the  symptoms for which Dr. Eden Emms has evaluated her earlier this year and  did not think they were  secondary to coronary artery disease.      Theodore Demark, PA-C  Electronically Signed      Annette Hoffman. Eden Emms, MD, [facility]  Electronically Signed   RB/MedQ  DD: 07/07/2006  DT: 07/07/2006  Job #: [id number]   cc:   Huel Cote, M.D.

## 2010-05-20 NOTE — H&P (Signed)
NAME:  Annette Hoffman, Annette Hoffman NO.:  [id number]   MEDICAL RECORD NO.:  [id number]          PATIENT TYPE:  EMS   LOCATION:  MAJO                         FACILITY:  [facility]   PHYSICIAN:  Everardo Beals. Juanda Chance, MD, FACCDATE OF BIRTH:  Feb 04, 1980   DATE OF ADMISSION:  08/31/2006  DATE OF DISCHARGE:                              HISTORY & PHYSICAL   CARDIOLOGIST:  Dr. Eden Emms.   PRIMARY CARE PHYSICIAN:  Dr. Rudi Heap.   CHIEF COMPLAINT:  Chest pain and palpitations.   MEDICAL HISTORY:  Ms. Arave is a 30 year old and has been  evaluated by Dr. Eden Emms for symptoms of congestive heart failure and  palpitations.  She has had a normal echo and negative stress test in the  past.  She has had any arrhythmias documented from the palpitations.  She wore an event monitor about 2 years ago.  She was just seen recently  by Dr. Eden Emms and he thought she had atypical chest pain and prescribed  the Celebrex for her.   PAST MEDICAL HISTORY:  Significant for:  1. An elevated heart rate, which has been worked up and she has been      on a beta-blocker for this.  2. She also has had hyperlipidemia, which has been managed with diet.  3. She also has a history of anxiety.  4. She has a 68-1/2 month old child.   CURRENT MEDICATIONS:  Include only the Celebrex, which she just started  this morning.   SOCIAL HISTORY:  She is married and has one 91-month-old child.  She does  not smoke.   FAMILY HISTORY:  Noncontributory.   REVIEW OF SYSTEMS:  Positive for symptoms of fatigue.   PHYSICAL EXAMINATION:  VITAL SIGNS:  The blood pressure was 105/79 and  the pulse was 72 and regular.  There was no venous distention.  The  carotid pulses were full without bruits.  CHEST:  Clear without rales or rhonchi.  HEART:  Rhythm was regular.  The heart sounds were normal.  There are no  murmurs or gallops.  ABDOMEN:  Soft.  Normal bowel sounds.  There is no hepatosplenomegaly.  Peripheral pulses were  full with no peripheral edema.  MUSCULOSKELETAL:  Showed no deformities.  SKIN:  Warm and dry.  NEUROLOGIC EXAMINATION:  Showed no focal neurological signs.   IMPRESSION:  1. Chest pain, probably musculoskeletal.  2. Hyperlipidemia.  3. Anxiety.   RECOMMENDATIONS:  I think Ms. Sola-Bradford was advised to come to the  emergency room to see if an arrhythmia could be detected.  However, the  sharp sensations that she had in her chest are not associated with any  arrhythmias on the monitor and I think they are probably  musculoskeletal.  I recommend that she follow up with the Celebrex that  Dr. Eden Emms prescribed and also gave her some Valium as a muscle  relaxant.  I suggested she follow up with Dr. Christell Constant in the next couple  of weeks.  She also has had some symptoms of fatigue and says her heart  rate has been in the 50s on metoprolol.  I recommend that she continue  the metoprolol for  now, but after her current problems aside, if she is  still having symptoms of fatigue and she is still having heart rates in  the 50s, I told her she could try off of the metoprolol.  She has a  scheduled followup visit with Dr. Eden Emms in 6 months.      Bruce Elvera Lennox Juanda Chance, MD, [facility]  Electronically Signed     BRB/MEDQ  D:  08/31/2006  T:  08/31/2006  Job:  [id number]   cc:   [name]. Eden Emms, MD, [facility]  Ernestina Penna, M.D.

## 2010-05-20 NOTE — Assessment & Plan Note (Signed)
[facility]                                 ON-CALL NOTE   BRYANT, LIPPS             MRN:          [id number]  DATE:11/25/2006                            DOB:          Jul 10, 1980    I received a call for Ms. Sola-Bradford this evening, stating that she  has been having some brief spasm-like discomforts in the center of her  chest lasting just a second or two and resolving spontaneously.  She has  no associated symptoms.  She has been evaluated by Dr. Eden Emms in the  past for similar-type of what was considered atypical discomfort and she  has had normal echocardiogram earlier this year.  She says she has never  undergone stress testing, and at one point was recommended to have a  cardiac CT, which was unable to be performed secondary to insurance  issues.  I advised that, on the surface, her pain does not sound cardiac  in nature, as it is fairly fleeting.  However, it is difficult to know  over the phone and, if she is uncomfortable or continues to have  symptoms, that she should present to the [facility] ED for additional  evaluation.  She was agreeable and grateful for the call back.      Nicolasa Ducking, ANP  Electronically Signed      [name]. Eden Emms, MD, [facility]  Electronically Signed   CB/MedQ  DD: 11/25/2006  DT: 11/26/2006  Job #: [id number]

## 2010-05-20 NOTE — H&P (Signed)
NAMEMarland Kitchen  ARSHIA, SPELLMAN NO.:  [id number]   MEDICAL RECORD NO.:  [id number]          PATIENT TYPE:  EMS   LOCATION:  MAJO                         FACILITY:  [facility]   PHYSICIAN:  Gerrit Friends. Dietrich Pates, MD, FACCDATE OF BIRTH:  1981-01-05   DATE OF ADMISSION:  08/13/2006  DATE OF DISCHARGE:  08/13/2006                              HISTORY & PHYSICAL   PRIMARY CARE PHYSICIAN:  [facility]   HISTORY OF PRESENT ILLNESS:  A 30 year old woman with a history of  intermittent chest discomfort and palpitations, referred to the  emergency department by Dr. Riley Kill for same.  Ms. Labell has had  episodes of chest discomfort since the age of 56.  These are typically  self-limited and infrequent.  She had done well until yesterday, when  she noted some sharp left upper chest discomfort of moderate severity.  The episodes lasted a matter of seconds.  There were no associated  symptoms.  There was some associated chest wall tenderness.  She was  seen in the emergency department at [facility],  where a chest x-ray and EKG were reportedly negative.  She does have a  history of intermittent right bundle branch block.  She was prescribed  Xanax and Darvocet, but did not want to take these until she had  discussed her situation with Dr. Eden Emms.  She called the office today,  but Dr. Eden Emms was not working.  She was referred to Dr. Riley Kill, who  advised return to the emergency department.   Ms. Brookover delivered a healthy child 5 months ago.  She had some  subsequent dyspnea and was once again evaluated by Dr. Eden Emms.  A CT  scan of the chest was recommended, but denied by her insurance.  She  subsequently had an echocardiogram and venous Doppler studies that were  negative.  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  She takes metoprolol for her palpitations with resolution.  She reports  no history of significant  anxiety, except that she recently was  prescribed Prozac for unclear reasons.  Upon withdrawal, she has had  anxiety episodes and nightmares.  She has not taken any Prozac for the  past 2 days.   ALLERGIES:  Multiple drug allergies are reported including:  CEPHALOSPORINS, PENICILLIN, SULFA DRUGS, VICODIN, and now PROZAC.   MEDICATIONS:  1. Toprol 25 mg daily.  2. Zantac p.r.n.   PAST MEDICAL , SOCIAL AND FAMILY HISTORY:  As noted on the handwritten  emergency department record.   PHYSICAL EXAMINATION:  GENERAL:  A very pleasant overweight woman in no  acute distress.  The temperature is 97.8, heart rate 85, respirations 20, blood pressure  130/65, O2 saturation 98% on room air.  HEENT:  Anicteric sclerae; normal lids and conjunctiva; normal oral  mucosa.  NECK:  No jugular venous distension; no carotid bruits.  CARDIAC:  Normal first and second heart sounds.  ABDOMEN:  Soft and nontender, no organomegaly.  THORAX:  Mild left upper chest tenderness.  EXTREMITIES:  No edema; normal distal pulses.  NEUROLOGIC:  Symmetric strength and tone.   IMPRESSION:  Ms. Ahola has recurrent atypical chest  discomfort.  She is at extremely low risk for significant pathology, especially with  a recent negative evaluation at [facility] emergency department.  A D-  dimer level was ordered from the office and is pending.  If negative, we  will treat her empirically with Naprosyn 400 mg b.i.d. for 3 days.  She  will call her primary care doctor or Dr. Eden Emms if symptoms persist  thereafter.      Gerrit Friends. Dietrich Pates, MD, [facility]  Electronically Signed     RMR/MEDQ  D:  08/13/2006  T:  08/15/2006  Job:  [id number]

## 2010-05-20 NOTE — Discharge Summary (Signed)
NAMEMarland Kitchen  Annette, Hoffman NO.:  [id number]   MEDICAL RECORD NO.:  [id number]          PATIENT TYPE:  INP   LOCATION:  9115                          FACILITY:  [facility]   PHYSICIAN:  Zenaida Niece, M.D.DATE OF BIRTH:  Aug 19, 1980   DATE OF ADMISSION:  03/08/2008  DATE OF DISCHARGE:  03/10/2008                               DISCHARGE SUMMARY   ADMISSION DIAGNOSES:  Intrauterine pregnancy at 39 weeks, previous  cesarean section.   DISCHARGE DIAGNOSES:  Intrauterine pregnancy at 39 weeks, previous  cesarean section.   PROCEDURES:  On March 4, she had a repeat low-transverse cesarean  section.   COMPLICATIONS:  None.   HISTORY AND PHYSICAL:  Please see chart for full history and physical.  Briefly, this is a 30 year old gravida 2, para 1-0-0-1 with an EGA of [redacted]  weeks who is admitted for repeat cesarean section.  Past history is  significant for one low-transverse cesarean section.  She also has a  history of supraventricular tachycardia, leukocytosis, and anxiety.  Physical exam is significant for weight of 263 pounds, a gravid abdomen  with a well-healed transverse scar and cervix is fingertip 30 and high.   HOSPITAL COURSE:  The patient was admitted on March 4 and underwent  repeat low-transverse cesarean section under spinal anesthesia.  She had  normal anatomy and delivered a viable female infant with Apgars of 9 and  9, weight 8 pounds 7 ounces.  Estimated blood loss was 1000 mL.  Postoperatively, she had no significant complications.  She was rapidly  able to ambulate and tolerate a diet and remained afebrile.  Pre-  delivery hemoglobin 10.5, post-delivery 9.1.  On postoperative #2, the  patient was doing well and requested discharge.  Her incision was  healing well and she was felt to be stable enough for discharge home.   DISCHARGE INSTRUCTIONS:  Regular diet, pelvic rest.  Followup is in 3  days for staple removal.  Medications are Percocet #40 one to two  p.o.  q.4-6 h. p.r.n. pain and over-the-counter ibuprofen as needed and she is  given our discharge pamphlet.      Zenaida Niece, M.D.  Electronically Signed     TDM/MEDQ  D:  03/10/2008  T:  03/10/2008  Job:  [id number]

## 2010-05-20 NOTE — Assessment & Plan Note (Signed)
[facility]                       [place] CARDIOLOGY OFFICE NOTE   Annette Hoffman, Annette Hoffman             MRN:          [id number]  DATE:08/25/2006                            DOB:          03/11/1980    Annette Hoffman returns today for followup.  She has had longstanding atypical  chest pain.  The patient was recently seen in the emergency room on  April 09, 2006 and again last week for atypical pain.  She ruled out for  myocardial infarction.  She was seen at Dr. Kathi Der office.  They  ordered an abdominal  CT scan on her, which was negative.  I reviewed  these from [facility] that were done two days ago.  The patient's pain  is atypical but is almost constant for the last two weeks.  It has been  sharp, radiating to the left under her left breast.  There has been no  pleuritic component, no associated shortness of breath, nausea or  vomiting.  Her review of systems is remarkable for some tingling in her  left fingers.  There has been no other focal neurological signs.   The patient has been recovering from her pregnancy well.  Her weight is  starting to come down.  She had an LDL cholesterol of 123 when checked.  Her baby was with her today.   CURRENT MEDICATIONS:  Include Lopressor 25 a day, which has done a nice  job bringing her heart rate down.  She had been tried on Prozac for some  anxiety, but this made things worse, and she is off it now.   EXAMINATION:  VITAL SIGNS:  Remarkable for a heart rate of 80, blood  pressure of 130/80, afebrile.  Respiratory rate is 14.  Weight is 236.  HEENT:  Normal.  Carotids normal without bruits.  There is no  lymphadenopathy, no thyromegaly, no JVP elevation.  LUNGS:  Clear with diaphragmatic motion.  No wheezing.  There is no S1,  S2, no rub.  PMI is normal.  ABDOMEN:  Benign.  There is some striae.  There is a small umbilical  scar.  There is no tenderness.  Bowel sounds are positive.  No  hepatosplenomegaly, no hepatojugular reflux, no AAA, no bruits.  Distal  pulses are intact, no edema.  Femorals are +4.  PTs are +3.  NEURO:  Nonfocal.  There is no muscular weakness.  SKIN:  Warm and dry.   Her electrocardiogram shows normal sinus rhythm with nonspecific ST-T  wave changes in the lateral leads.  These are old.  She has an  incomplete right bundle branch block.   IMPRESSION:  1. Atypical chest pain, previous workup negative.  I wanted to do a      cardiac CT on the patient, but her insurance would not cover it.  I      do not think she needs another stress test at this point.  She has      had normal stress tests in the past and a normal echocardiogram.      There has been no evidence of postpartum cardiomyopathy.  We will      check a sed  rate and CPK today.  2. Hypercholesterolemia.  Cholesterol is on 123.  She is continuing to      lose weight after her pregnancy.  Given her age of 31, I do not      think it is reasonable to start drugs.  Long-term toxicity to the      liver would far outweigh any benefit at this point in time.  I      would continue diet therapy.  3. Palpitations with elevated heart rate, previously worked up.  No      abnormal physiologic call for this.  Continue beta blocker at 25 mg      a day.  The patient seems to think this does a really nice job, and      her pulse at home has been between 60 and 70.  4. Anxiety.  Follow up with primary care MD.  Annette Hoffman clearly has had      some ups and downs in regards to this.  She felt worse on Prozac,      but there may be another SSRI that she can take.   I will see her back in about 6 months, so long as her blood work is  okay.     Annette Hoffman. Eden Emms, MD, [facility]  Electronically Signed    PCN/MedQ  DD: 08/25/2006  DT: 08/26/2006  Job #: [id number]

## 2010-05-20 NOTE — Assessment & Plan Note (Signed)
[facility]                            CARDIOLOGY OFFICE NOTE   LUNDEN, STIEBER             MRN:          [id number]  DATE:07/15/2006                            DOB:          [birth year]    Isamar returns today for followup.  She is postpartum.   She has been having some atypical chest pain and some palpitations.  She  does have a bit of anxiety.  She was started on Prozac recently.   In talking to her, she has noted that her heart rate has been high.  It  has been high in the past.  She had previously been on Toprol, I think  she needs to resume this.  I reviewed blood work that she had from  [facility].  Her hematocrit was 37.4, her LDL  cholesterol was 154, BUN and creatinine were normal.  TSH was 3.7 with a  T4 of 5.7.   In regards to her dyspnea, it is functional.  She still has gained quite  a bit of weight since her pregnancy.  There has been no history of DVT,  although some of her pain does sound a little bit pleuritic.  There has  been no fever and cough.   The pain in her chest is central, it radiates up towards her collarbone  but not down her arms.  There is no associated diaphoresis.   Palpitations are off and on.  There are skipped beats and premature  beats and occasional thumping.  There is no constant rapid heart  beating.   REVIEW OF SYSTEMS:  Otherwise negative.   CURRENT MEDICATIONS:  1. Zantac 150 a day.  2. Prozac.  3. Multivitamins.   PHYSICAL EXAMINATION:  Remarkable for a healthy-appearing, somewhat  anxious, white female in no distress.  Weight is 243 up from her baseline which is closer to 232.  Affect is  appropriate.  Respiratory rate is 14.  Blood pressure is 135/83, pulse  is anywhere from 90 to 105.  HEENT:  Normal.  Thyroid is normal and there are no nodules.  No  lymphadenopathy, no JVP elevation, no carotid bruits.  LUNGS:  Clear with good diaphragmatic motion.   There is no wheezing.  There is an S1, S2 with normal heart sounds.  PMI is not palpable.  ABDOMEN:  Protuberant.  She has some stria from her C-section.  The scar  is well healed.  Bowel sounds positive, no hepatosplenomegaly, no  hepatojugular reflux.  Distal pulses are intact with no edema.  NEURO:  Nonfocal.  There is no muscular weakness.   EKG essentially shows sinus tachycardia with no other acute changes.   IMPRESSION:  1. Tachycardia postpartum, some anxiety.  I am not sure that this      represents a structural problem; however, I am somewhat concerned      that she may have developed some postpartum cardiomyopathy.  She      did have an echocardiogram done April of 2008 with an ejection      fraction of 50% to 55%.  She will have a followup echo in the next  few days to reassess this and make sure it is not deteriorating.      She will start her Toprol back at 25 mg a day.  I do not think she      is having any other significant arrhythmias since she appears to      primarily have sinus tachycardia.  Her hematocrit is fine.  She is      not hyperthyroid, and we will see what her response is to Toprol      and make sure her left ventricular function is normal.  2. Shortness of breath, some pleuritic component, status post      hypercoagulable state and being pregnant.  Check chest CT to rule      out PE.  The patient will have this done tomorrow morning.  Will      place her on low-dose hydrochlorothiazide in regards to some lower      extremity edema and her shortness of breath to see if this helps.  3. Question postpartum depression.  Continue current dose of Prozac.      Follow up with primary care MD.  4. Reflux and gastroesophageal reflux disease, improving some after      her pregnancy, but continue Zantac 150 a day.   I will see Zyria back in about 4 weeks to see how her heart rate is  doing, or sooner if her tests are abnormal.     Theron Arista C. Eden Emms, MD,  [facility]  Electronically Signed    PCN/MedQ  DD: 07/15/2006  DT: 07/16/2006  Job #: [id number]

## 2010-05-20 NOTE — Assessment & Plan Note (Signed)
[facility]                            CARDIOLOGY OFFICE NOTE   [name], [name]             MRN:          [id number]  DATE:07/07/2006                            DOB:          August 20, 1980    [name] is a very pleasant 30 year old female who is followed by Dr.  Eden Emms.  She apparently has a long history of chest pain and  palpitations.  She states she has had intermittent chest pain since age  38.  Notes she had a VQ scan performed in October of 2007 that was  normal. She has also had previous echocardiograms.  Her most recent was  on April 23, 2006.  Her LV function was at the lower limits of normal in  the 50-55% range.  There were no other abnormalities noted.  She states  again that she has had chest pain intermittently since age 78.  She had  chest pain this morning for approximately 2 hours.  It was in the left  upper chest area and described as a heaviness.  It did not radiate.  It  was non pleuritic or positional nor was it related to food.  She states  she had mild nausea but there was no shortness of breath or diaphoresis.  It lasted for approximately 2 hours then resolved spontaneously.  There were no relieving factors.  She went to see her primary care  physician and was referred to our office.  She is presently pain free.  Note, she does not have dyspnea on exertion, orthopnea or PND or pedal  edema.  She has had no recent travel.  There is no melena or  hematochezia.   MEDICATIONS:  1. Zantac 150 mg p.o. p.r.n.  2. Lexapro 10 mg p.o. daily (on hold).   PHYSICAL EXAMINATION:  VITAL SIGNS TODAY:  Blood pressure 130/81, pulse  is 87.  She weighs 248 pounds.  HEENT:  Normal.  NECK:  Supple.  CHEST:  Clear.  CARDIOVASCULAR EXAM:  Reveals a regular rate and rhythm.  ABDOMINAL EXAM:  Benign. Note, she is mildly tender in the left upper  chest area.  EXTREMITIES:  Show no edema and I can palpate no cords.   Her  electrocardiogram shows a sinus rhythm at a rate of 82.  There is  right axis deviation which is unchanged from previous.  There are no  significant ST changes.   DISCHARGE DIAGNOSES:  1. Atypical chest pain - the etiology of her symptoms is not clear to      me. She is mildly tender over that area and it could be      musculoskeletal. I note she has no EKG changes and I doubt this is      cardiac.  I also note that it has been intermittent since age 15.      An echocardiogram previously has shown normal LV function.  Her      previous VQ was unremarkable.  We will not pursue further cardiac      workup at this point.  If it becomes more problematic  then I have      asked her  to followup with Dr. Eden Emms for further workup.  2. History of palpitations - she has been on Toprol in the past and      asked whether she should resume this.  I told her that if her      palpitations were significantly bothersome then that would be      appropriate.  3. History of pregnancy induced hypertension - her blood pressure is      normal today.   She will see Dr. Eden Emms back in 8 weeks.     Madolyn Frieze Jens Som, MD, [facility]  Electronically Signed    BSC/MedQ  DD: 07/07/2006  DT: 07/07/2006  Job #: [id number]   cc:   [name]. Eden Emms, MD, [facility]

## 2010-05-23 NOTE — Assessment & Plan Note (Signed)
[facility] HEALTHCARE                            CARDIOLOGY OFFICE NOTE   Lajean Saver SUHAYLAH WAMPOLE                      MRN:          [id number]  DATE:01/07/2006                            DOB:          07-19-1980    Azjah returns today for followup.  She is [redacted] weeks pregnant.  She is  having a baby girl named Zowe.  I took care of Rylee's mom who died  after a kidney transplant.  Her weight is up 23 pounds.  She continues  to have somewhat atypical chest pain with some numbness going down her  left arm.  Her blood pressure seems to be rising, she was at 146/90  today.  Her pulse is mildly elevated at 95 as well.  I told her to  follow up with Dr. Eligha Bridegroom office in regards to checking her urine  protein to make sure that there is no preeclampsia.   From a cardiac prospective I think she is stable.  She has had a 2-D  echocardiogram with normal LV function and no effusion.  Her EKG's are  normal except for some relative tachycardia.   REVIEW OF SYSTEMS:  Otherwise remarkable for some lower extremity edema,  right greater than left.   MEDICATIONS:  Include:  1. Zantac 150 daily.  2. Lexapro 10 daily.  3. Iron.  4. __________.   Blood pressure today in the office was 146/90, pulse was 98 and regular.  HEENT:  Was normal.  LUNGS:  Are clear.  Carotids are normal.  There is a normal S1, S2 with a flow murmur in the right sternal border.  ABDOMEN:  Is protuberant consistent with [redacted] week gestational age.  Distal pulses are intact with +1 edema on the right.   EKG showed sinus rhythm without acute changes.   IMPRESSION:  In the setting of atypical chest pain and pregnancy with  right lower extremity swelling, we will check a venous duplex.  She did  have a VQ scan with her initial episode of pain a few weeks ago, this  was negative.  I doubt that there is any significant deep vein  thrombosis. I suspect she has some normal swelling from her  pregnancy.  She will follow up with Dr. Eligha Bridegroom office to follow her blood  pressure, make sure she is not preeclampsia.  Her due  date is in March.  I will see her after this.  We will continue her  current medications.  I do not see the need to start beta blockers at  this time.     [name]. Eden Emms, MD, [facility]  Electronically Signed    PCN/MedQ  DD: 01/07/2006  DT: 01/07/2006  Job #: [id number]

## 2010-05-23 NOTE — Assessment & Plan Note (Signed)
[facility] HEALTHCARE                              CARDIOLOGY OFFICE NOTE   Annette Hoffman, Annette Hoffman                      MRN:          [id number]  DATE:10/26/2005                            DOB:          06-06-1980    [name] is seen today in followup.   I believe I last saw her in April 2006.  I had been taking care of her  mother.  Unfortunately, Annette Hoffman passed recently.  She got a kidney  transplant at [facility] and then had fulminant liver failure.  Annette Hoffman  has had a couple of episodes of recent chest pain that were atypical.  She  was seen in the ER both at [place] and at [place].  She had a VQ scan despite  being pregnant, which was negative.  Her pain sounds atypical.  It sounds  musculoskeletal.  She is about 21 weeks now.  As far as I can, the baby is  healthy.  She sees Dr. Jackelyn Knife in [place].   There has been no fever and no evidence of pericarditis.  She has not had  recurrent pleuritic or any other chest pain.  She has a little bit of  dyspnea but I think this has to do with her pregnancy and weight gain.   REVIEW OF SYSTEMS:  Otherwise remarkable for some lower back pain.   Her blood pressure is 120/70.  Pulse 88 and regular.  LUNGS:  Clear.  Carotids are normal.  There is an S1, S2 with normal heart sounds.  ABDOMEN:  Protuberant, consistent with gestational age.  Distal pulses are intact with no edema.   1. She is taking Zantac 150 a day.  2. Lexapro 10 a day.   EKG is normal, except for an incomplete right bundle branch block.   Her last echo on May 2006 was essentially normal.   IMPRESSION:  Stable 22 weeks' pregnant.  A lot of emotional stress with  mother's recent passing and pregnancy.  The patient will have a followup 2-D  echocardiogram to make sure there is no evidence of cardiomyopathy given her  pregnancy and recurrent chest pain.  We will also like to make sure there is  no pericardial effusion.   I suspect that  her pain was musculoskeletal and benign in nature.  I will  see her back in about 2 months as long as her echo is good.   ______________________________  [name] Eden Emms, MD, [facility]   PCN/MedQ  DD: 10/26/2005  DT: 10/27/2005  Job #: [id number]

## 2010-05-23 NOTE — Discharge Summary (Signed)
NAMEMarland Kitchen  Annette Hoffman, Annette Hoffman NO.:  [id number]   MEDICAL RECORD NO.:  [id number]          PATIENT TYPE:  INP   LOCATION:  9138                          [facility]ACILITY:  [facility]   PHYSICIAN:  Huel Cote, M.D. DATE OF BIRTH:  06/21/1980   DATE OF ADMISSION:  03/11/2006  DATE OF DISCHARGE:  03/14/2006                               DISCHARGE SUMMARY   DISCHARGE DIAGNOSES:  1. Term pregnancy at 40+ weeks.  2. Occasional elevated blood pressures consistent with pregnancy-      induced hypertension.  3. Status post primary low transverse C-section for arrest of      dilation.   DISCHARGE MEDICATIONS:  1. Motrin 600 mg p.o. every 6 hours.  2. Percocet 1-2 tablets p.o. every 4 hours p.r.n.   DISCHARGE FOLLOWUP:  The patient will follow-up in office in 2 weeks for  an incision check.   HOSPITAL COURSE:  The patient is 30 year old G1 P0 who was admitted at  40 plus weeks gestation for induction of labor given post due dates and  occasional increased blood pressure. Her prenatal care had been  complicated by leukocytosis which was found on routine labs. Hematology  had seen the patient and felt that it was reactive change only. Reflux  was also treated with Zantac for episodes of chest pain and shortness of  breath. The patient did have a workup with a VQ scan and was started on  Toprol for his VT. She had some anxiety throughout pregnancy. Prenatal  labs were as follows; O negative, antibody negative, RPR nonreactive,  rubella immune, hepatitis B surface antigen negative, GC negative,  chlamydia negative, 1-hour Glucola 117, group B strep negative.   PAST MEDICAL HISTORY:  She had a history of SVT and thyroid dysfunction.   PAST SURGICAL HISTORY:  Ear surgery, left knee ACL, and wisdom tooth  surgery.   ALLERGIES:  Included PENICILLIN.   MEDICATIONS:  Included Zantac and Lexapro p.r.n.   PHYSICAL EXAMINATION:  VITAL SIGNS:  On admission she was afebrile with  stable vital  signs. Blood pressures was 130 over 80-90. Fetal heart rate  was reactive.  PELVIC:  Cervix was 1+, 30 and -2 station.   She was brought in and then placed on Pitocin and made progress to 6 cm.  Upon reaching 6 cm the patient remained at that status for approximately  3-4 hours and the decision was made to proceed with cesarean section.  She underwent a low transverse C-section and was delivered of a viable  female infant, Apgars were 9 and 9, weight was 7 pounds 7 ounces.  She  was then admitted for routine postoperative care. Postop day #1 her  hemoglobin dropped to 10 down from 11.  Her leukocyte count went from  11.9 to 22, however, she had no signs or symptoms of infection.  On postop day #3 she was doing quite well.  She had no complaints.  She  was afebrile with stable vital signs.  Fundus was firm.  Incision was  clean, dry, intact and Steri-Strips were placed and staples removed.  Therefore she was felt stable for discharge home and was discharged with  follow-up in the office as stated.      Huel Cote, M.D.  Electronically Signed     KR/MEDQ  D:  04/09/2006  T:  04/09/2006  Job:  [id number]

## 2010-05-23 NOTE — Assessment & Plan Note (Signed)
[facility]ALTHCARE                                   ON-CALL NOTE   MANIYAH, MOLLER                        MRN:          [id number]  DATE:08/06/2005                            DOB:          1980-12-20    [name] is a 30 year old female who is 10-weeks pregnant. 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 She reports that she has been having chest cramping on and off  since the age of 8, as well as tachy palpitations for which she takes  Toprol. She has not had any palpitations today.  She did have recurrent  cramping that persisted throughout the day and she had one of the nurses at  [facility] do an EKG on her and by her report it is reading out incomplete  right bundle branch block. She does not know if this was present in the  past.  She called and asked what she should do.  I advised that she should  present at the emergency department considering that she is having chest  pain in the setting of 10-weeks pregnancy, to be evaluated by an ER  physician and have her EKG evaluated and compared to an old EKG.  She  apparently had an echocardiogram in the past which was normal but has never  had a stress-test.  She said she is not sure if she wants to go down to the  ED and thinks the cramping is more or less the same as it has been for the  past 7 years.  I advised her that if she would like some reassurance that  she should go to the ED and we can see her if necessary.  She verbalized  understanding.                                   Nicolasa Ducking, ANP                                [name]. Eden Emms, MD, [facility]   CB/MedQ  DD:  08/06/2005  DT:  08/06/2005  Job #:  [id number]

## 2010-05-23 NOTE — Op Note (Signed)
NAME:  Annette Hoffman, Annette Hoffman NO.:  [id number]   MEDICAL RECORD NO.:  [id number]          PATIENT TYPE:  INP   LOCATION:  9138                          [facility]ILITY:  [facility]   PHYSICIAN:  Leighton Roach Meisinger, M.D.DATE OF BIRTH:  07/18/1980   DATE OF PROCEDURE:  03/11/2006  DATE OF DISCHARGE:                               OPERATIVE REPORT   PREOPERATIVE DIAGNOSIS:  Intrauterine pregnancy at 40 weeks, pregnancy-  induced hypertension and arrest of dilation.   POSTOPERATIVE DIAGNOSIS:  Intrauterine pregnancy at 40 weeks, pregnancy-  induced hypertension and arrest of dilation.   PROCEDURE:  Primary low transverse cesarean section without extensions.   SURGEON:  Zenaida Niece, M.D.   ANESTHESIA:  Epidural.   SPECIMENS:  Placenta sent for cord blood collection and then to labor  and delivery.   ESTIMATED BLOOD LOSS:  800 mL.   COMPLICATIONS:  None.   FINDINGS:  The patient had normal anatomy and delivered a viable female  infant with Apgars of nine and nine that weighed 7 pounds 7 ounces.   PROCEDURE IN DETAIL:  The patient was taken to the operating room and  placed in the dorsal supine position with left lateral tilt.  Her  previously placed epidural was dosed appropriately.  Abdomen was prepped  and draped in the usual sterile fashion and a Foley catheter had  previously been placed.  The level of her anesthesia was found to be  adequate and abdomen was entered via a standard Pfannenstiel's incision.  A disposable self-retaining retractor was placed and a 4 cm transverse  incision was made in the lower uterine segment pushing the bladder  inferior.  Once the uterine cavity was entered the incision was extended  bilaterally digitally.  The fetal vertex was grasped and delivered  through the incision atraumatically.  Mouth and nares were suctioned  with a bulb as light to moderate meconium fluid had been noted.  The  remainder of the infant then delivered  atraumatically.  The cord was  doubly clamped and cut and the infant handed to the awaiting pediatric  team.  Placenta delivered spontaneously and was sent for cord blood  collection and extra membranes were removed from the uterus.  The uterus  was wiped dry with a clean lap pad and all clots and debris removed.  Uterine incision was inspected and found to be free of extensions.  Uterine incision was closed in two layers with the first layer being a  running locking layer with #1 chromic and the second layer being an  imbricating layer also with #1 chromic.  Bleeding from the right side  was controlled with a figure-of-eight suture of 3-0 Vicryl and bleeding  from serosal edges was controlled with electrocautery.  Tubes and  ovaries were inspected and found to be normal.  Incision was again  inspected and found to be hemostatic.  Subfascial space was irrigated  and made hemostatic with electrocautery.  Fascia was closed in running  fashion starting at both ends and meeting in the middle with 0 Vicryl.  Subcutaneous tissue was then irrigated and made  hemostatic with electrocautery.  This was then closed with  running 3-0  Vicryl.  Skin was then closed with staples followed by a sterile  dressing.  The patient tolerated the procedure well and was taken to the  recovery room in stable condition.  Counts were correct x2 and she  received Ancef 1 gram IV after cord clamp.      Zenaida Niece, M.D.  Electronically Signed     TDM/MEDQ  D:  03/11/2006  T:  03/12/2006  Job:  [id number]

## 2010-05-23 NOTE — Op Note (Signed)
NAMESHAKARI, Annette Hoffman               ACCOUNT NO.:  [id number]   MEDICAL RECORD NO.:  [id number]          PATIENT TYPE:  REC   LOCATION:  OREH                         FACILITY:  [facility]   PHYSICIAN:  Erasmo Leventhal, M.D.DATE OF BIRTH:  03/17/1980   DATE OF PROCEDURE:  11/11/2004  DATE OF DISCHARGE:                                 OPERATIVE REPORT   PREOPERATIVE DIAGNOSIS:  Left knee torn anterior cruciate ligament, possible  torn meniscus, arthrofibrosis, sprain, medial and lateral collateral  ligaments.   POSTOPERATIVE DIAGNOSIS:  Left knee complete rupture of anterior cruciate  ligament, healing sprains, medial and lateral collateral ligament, unstable  posterior horn tear of the medial meniscus, irreparable.  Early  arthrofibrosis.   PROCEDURE:  Left knee examination under anesthesia, manipulation under  anesthesia, arthroscopic-assisted autograft, anterior cruciate ligament  reconstruction, partial medial meniscectomy.   SURGEON:  Erasmo Leventhal, M.D.   ASSISTANT:  Jaquelyn Bitter. Chabon, PA-C.   ANESTHESIA:  Femoral nerve block with general.   ESTIMATED BLOOD LOSS:  Less than 20 cc.   DRAINS:  One medium Hemovac.   COMPLICATIONS:  None.   TOURNIQUET TIME:  An hour and 30 minutes at 350 mmHg.   COMPLICATIONS:  None.   DISPOSITION:  To PACU stable.   OPERATIVE DETAILS:  Patient and family counseled in the holding area.  The  correct side was identified.  IV started.  Antibiotics were given.  Femoral  nerve block was administered per the anesthesiologist.  Taken to the OR.  Placed i supine position.  General anesthesia.  All extremities were well  padded and bump under left knee.  Examined.  She lacked 5 degrees of full  extension.  With gentle manipulation, we got full extension.  She  continuously flexed to 100 degrees with gentle manipulation.  She could flex  to 130 degrees.  She was examined.  Positive Lachman.  Positive anterior  drawer.  Positive flexion  and rotation.  Drawer is stable to varus and  valgus stress, 0 and 30 degrees of flexion.  The posterolateral __________  and PCL appeared to be stable.  Elevated.  Prepped with DuraPrep.  Draped in  a sterile fashion.  Exsanguinated with Esmarch.  The tourniquet was inflated  to 350 mmHg.  A straight midline incision was made through the skin and  subcutaneous tissues over the patellar tendon.  Dissection was carried down  through to the retinaculum and incised longitudinally.  The patellar tendon  measured over 30 mm in its widest diameter.  Following this, the central 10  mm was taken through the free graft with a bone prepped in the patella and  the tibia.  The graft was then taken to the back table.  It was properly  prepared by Mr. Leilani Able, PA-C.  A periosteal window was made just above  the past tendon insertion.  Patellar tendon defect closed with running  Vicryl suture.  Arthroscopic portals were now established.  Possible medial,  anterior medial, and anterolateral.  Diagnostic arthroscopy was undertaken.  The ACL was completely ruptured and __________ to the PCL with early  hemorrhage.  It was debrided.  PCL was intact.  She had a narrow A frame  notch.  The notch was __________ in a standard fashion.  I confirmed that it  was over the top position.  The patellofemoral joint did not reveal any  abnormalities.  The lateral side was inspected and normal tip of the  cartilage was on the lateral meniscus.  The medial side was inspected.  Very  mild scuffing of the medial femoral condyle articular cartilage from the  injury, and there was a complex irreparable tear of the posterior horn  medial meniscus.  At this time, utilizing the basket and the motorized  shaver, a partial medial meniscectomy was performed back to stable base  __________, removed approximately 40% of the posterior one third of the  medial meniscus and beveled into the mid one third.  The remaining meniscal   remnant was now found to be stable.   A guidepin was placed directly into the ACL footprint, and there was  endoscopic reaming with a reamer.  The tunnel was debrided.  The 7 mm over  top guide was hooked in over-top position except for 1 o'clock to the left.  Guidepin was placed and found to be in excellent position.  Endoscopic ream  to the appropriate depth, both tunnels were debrided and arthroscopic  debridement was removed from the knee.  The femoral tunnel was then notched.  A two-pin passer was placed and the graft was delivered through the knee.  It was securely fixed to the femoral bone and BioScrew was placed in a  parallel fashion given excellent purchase and fixation.  The knee was then  put through a range of motion.  There was no notch impingement.  The graft  had an excellent physiometric movement pattern and after the graft creep CRP  for the next several minutes.  At this time, at 30 degrees of flexion in  neutral rotation pulling distal and tibial bone block was securely fixed in  the tibial tunnel __________ in a parallel fashion.  Now checked the knee.  She had full extension and flexion of 110 degrees, only limited by the  drapes.  The knee was clinically stable.  Had excellent tension with probing  and no notch impingement and was well fixed on the femoral and tibial side.  The knee was then copiously irrigated and all arthroscopic equipment was  removed.  A drain was placed through the proximal cannula and then it was  removed.  The periosteal window was closed with Vicryl.  The patellar defect  had been closed with Vicryl.  Retinaculum with Vicryl.  Subcu with Vicryl.  The skin was closed with subcuticular Monocryl suture as was the portal  sites.  Steri-Strips were applied.  According to anesthesia, another 20 mL  of 0.25% Marcaine with epinephrine was placed in the joint to supplement the femoral nerve block after __________ anesthesia.  Sterile dressing was   applied to the knee.  The tourniquet was deflated.  Normal circulation of  the foot and ankle at the end of the case.  She was given another 1 gm of  Ancef intravenously.  I note that she does have a history of PENICILLIN  allergy, but it was not anaphylaxis.  She had received 1 gm of Ancef  preoperatively and had no problems with that.  An ice pack and TED hose was  applied.  Ice pack and knee immobilizer in full extension.  Gently awakened.  Taken  to the operating room and PACU in stable condition.  Patient tolerated  the procedure well with no complications or problems.   To help with guide preparation throughout the entire surgical procedure, Mr.  Leilani Able, PA-C's, service was needed.  The plan will be for overnight  stay for pain control.           ______________________________  Erasmo Leventhal, M.D.     RAC/MEDQ  D:  11/11/2004  T:  11/11/2004  Job:  [id number]

## 2010-05-23 NOTE — Assessment & Plan Note (Signed)
[facility]ALTHCARE                                   ON-CALL NOTE   Annette Hoffman, Annette Hoffman                        MRN:          [id number]  DATE:10/29/2004                            DOB:          [birth year]    SUMMARY OF HISTORY:  Annette Hoffman called the answering service on the morning of  October 29, 2005.  The answering service paged me, thinking that it was a  [employer] employee with question.  However, it is a patient of Dr.  Fabio Bering.   Annette Hoffman states that she has been followed by Dr. Eden Hoffman recently for chest  discomfort.  She stated that she has had a V/Q scan that was unremarkable  and he had scheduled her for an outpatient echocardiogram next week, but she  continues to have chest discomfort and wondered if she could have the  echocardiogram any sooner than that.   I discussed the situation with Dr. Eden Hoffman.  Dr. Eden Hoffman felt that her chest  discomfort is related to stress and anxiety, given her recent mother's  death.  The office schedule did not allow the echocardiogram to be done  earlier than Monday.  Thus, I arranged for the echocardiogram to be done at  [facility] on Thursday at 9 a.m. as an outpatient with Dr. Fabio Bering  approval.   Echocardiogram was performed.  Dr. Eden Hoffman and Dr. Jens Som reviewed and this  did not show any abnormalities.   After reviewing with Dr. Eden Hoffman, I called Annette Hoffman and explained to her that  Dr. Eden Hoffman felt that the echocardiogram did not show any cardiac anomalies.  He asked me to reassure her that her chest discomfort was not of cardiac  etiology.  He asked me to inform her that his nurse would be calling her  with an early followup appointment.     ______________________________  Joellyn Rued, PA-C    ______________________________  Annette Pick. Eden Emms, MD, [facility]   EW/MedQ  DD: 10/30/2005  DT: 11/02/2005  Job #: [id number]

## 2010-05-23 NOTE — Assessment & Plan Note (Signed)
[facility] HEALTHCARE                            CARDIOLOGY OFFICE NOTE   Annette Hoffman, Annette Hoffman             MRN:          [id number]  DATE:04/05/2006                            DOB:          [birth year]    Annette Hoffman returns today for followup.  She had an uneventful C-section  and has a healthy baby girl named Annette Hoffman.  I have been seeing her for  palpitations and chest pain as well as hypertension.  She probably had  some preeclampsia and delivered her baby by C-section at 40 weeks.   The patient has continued to retain some fluid and weight post delivery.  She has not had any chest pain or palpitations.   REVIEW OF SYSTEMS:  Remarkable for no significant syncope, no  significant dyspnea.  She is a little bit emotional after having her  baby since her mother passed recently.  Her husband has been very good  and actually was watching the baby in the car during her appointment.   CURRENT MEDICATIONS:  1. Lexapro 10 mg a day.  2. Zantac 150 a day.   She is currently not on blood pressure medicine.  She appeared to have  hypertension induced by pregnancy.  Her weight is 239, her blood  pressure is 129/85, pulse 84 and regular.  HEENT:  Normal.  LUNGS:  Clear.  There is an S1, S2 with normal heart sounds.  ABDOMEN:  Benign.  She is status post C-section.  Distal pulses are  intact with no edema.   IMPRESSION:  1. Stable pregnancy-related chest pain and palpitations, resolved.  2. Pregnancy-related hypertension, also resolved.   We will check a 2D echocardiogram to rule out any evidence of postpartum  cardiomyopathy.  She is otherwise doing well.  So long as her echo shows  normal LV function, I will see her back in 6 months.     Annette Pick. Eden Emms, MD, [facility]  Electronically Signed    PCN/MedQ  DD: 04/05/2006  DT: 04/05/2006  Job #: [id number]

## 2010-06-19 ENCOUNTER — Emergency Department (HOSPITAL_BASED_OUTPATIENT_CLINIC_OR_DEPARTMENT_OTHER)
Admission: EM | Admit: 2010-06-19 | Discharge: 2010-06-19 | Disposition: A | Discharge status: Home / Self Care | Payer: PRIVATE HEALTH INSURANCE | Attending: Emergency Medicine | Admitting: Emergency Medicine

## 2010-06-19 ENCOUNTER — Emergency Department (INDEPENDENT_AMBULATORY_CARE_PROVIDER_SITE_OTHER): Payer: PRIVATE HEALTH INSURANCE

## 2010-06-19 DIAGNOSIS — R209 Unspecified disturbances of skin sensation: Secondary | ICD-10-CM

## 2010-06-19 DIAGNOSIS — R51 Headache: Secondary | ICD-10-CM

## 2010-06-19 DIAGNOSIS — I10 Essential (primary) hypertension: Secondary | ICD-10-CM | POA: Insufficient documentation

## 2010-06-19 DIAGNOSIS — S060X0A Concussion without loss of consciousness, initial encounter: Secondary | ICD-10-CM | POA: Insufficient documentation

## 2010-06-19 DIAGNOSIS — K219 Gastro-esophageal reflux disease without esophagitis: Secondary | ICD-10-CM | POA: Insufficient documentation

## 2010-06-19 DIAGNOSIS — IMO0002 Reserved for concepts with insufficient information to code with codable children: Secondary | ICD-10-CM | POA: Insufficient documentation

## 2010-07-21 IMAGING — CT CT ORBIT/TEMPORAL/IAC W/O CM
4 of 7 series · 16 of 30 positions shown, 18 images · non-contrast
Comparison: Head CT 01/26/2005.

CLINICAL DATA: POSTERIOR AURICULAR PAIN ON THE RIGHT TIMES 3 DAYS.
36 WEEKS PREGNANT.

CT ORBITS WITHOUT CONTRAST
TECHNIQUE: Multidetector CT imaging of the orbits was performed
following the standard protocol without intravenous contrast.

[Series 100: axial · axial · 0.19mm/px · z∈[+103,+127]mm · 3 of 77 slices shown (1 of 4)]
[im 20/77  bone]
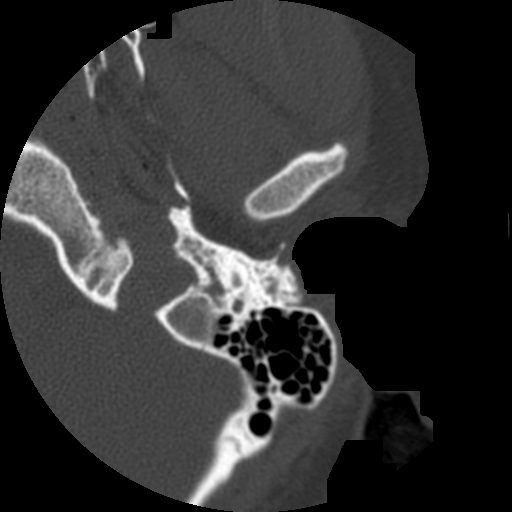
[im 39/77  bone]
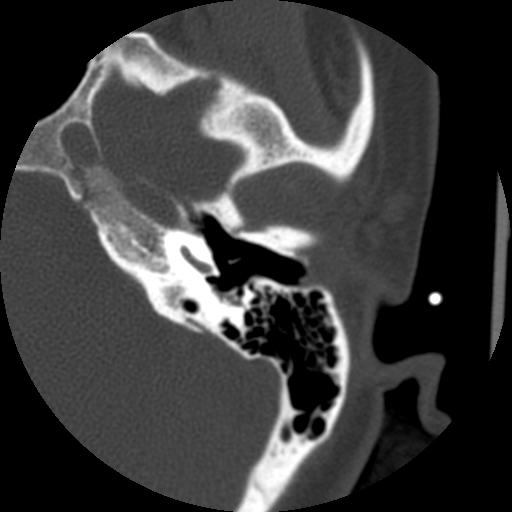
[im 58/77  bone]
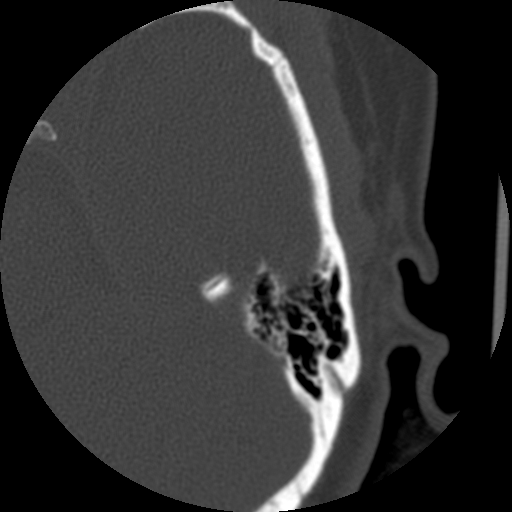

[Series 100: axial · axial · 0.19mm/px · z∈[+103,+127]mm · 3 of 77 slices shown (2 of 4)]
[im 20/77  bone]
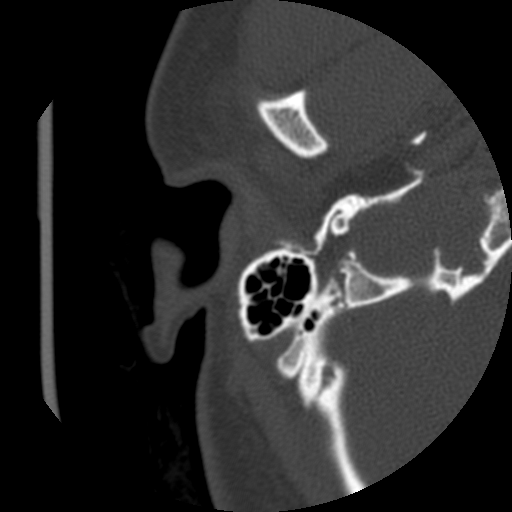
[im 39/77  bone]
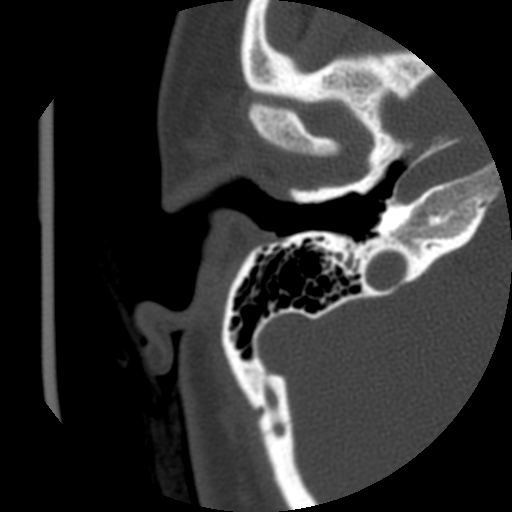
[im 58/77  bone]
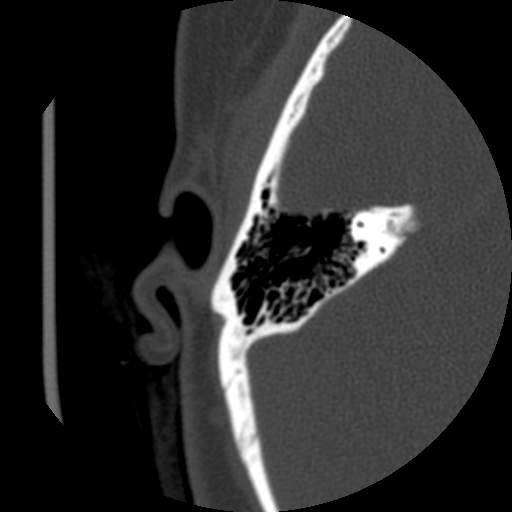

[Series 100: axial · axial · 0.19mm/px · z∈[+98,+133]mm · 7 of 152 slices shown, 9 images (3 of 4)]
[im 19/152  brain]
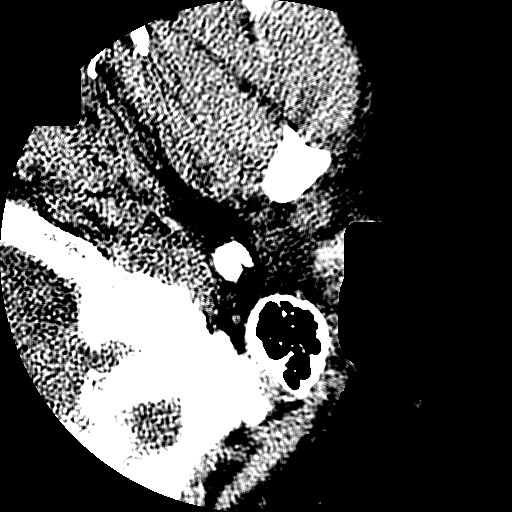
[im 19/152  bone]
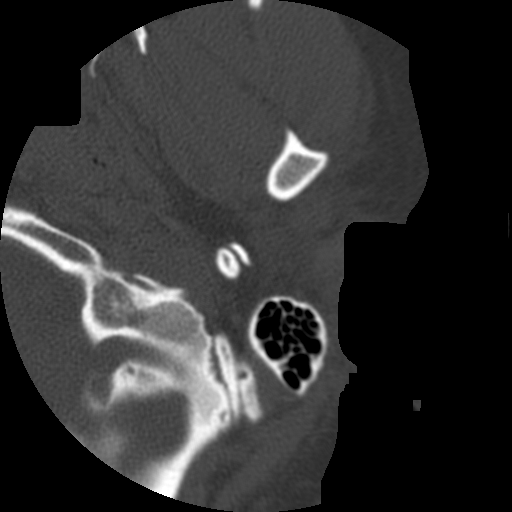
[im 38/152  bone]
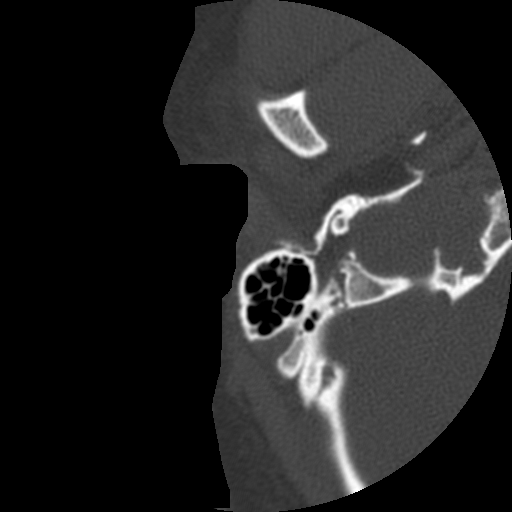
[im 57/152  bone]
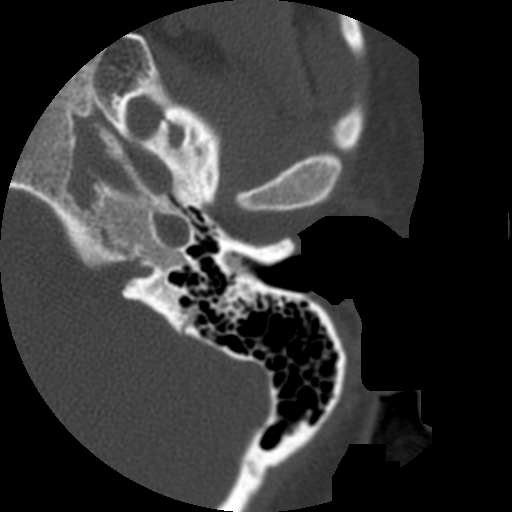
[im 76/152  bone]
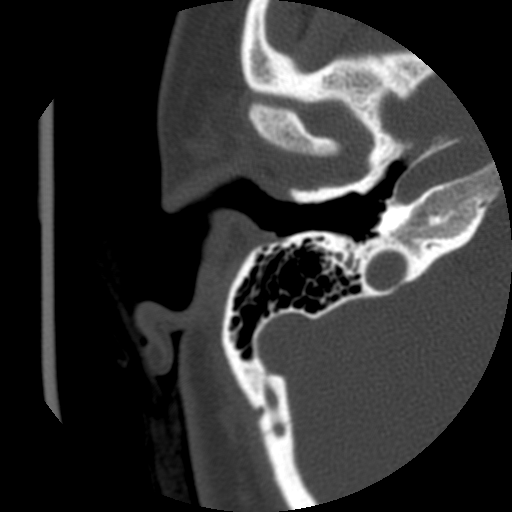
[im 95/152  brain]
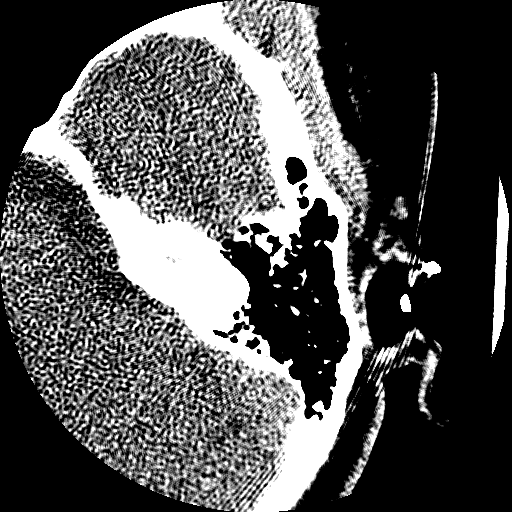
[im 95/152  bone]
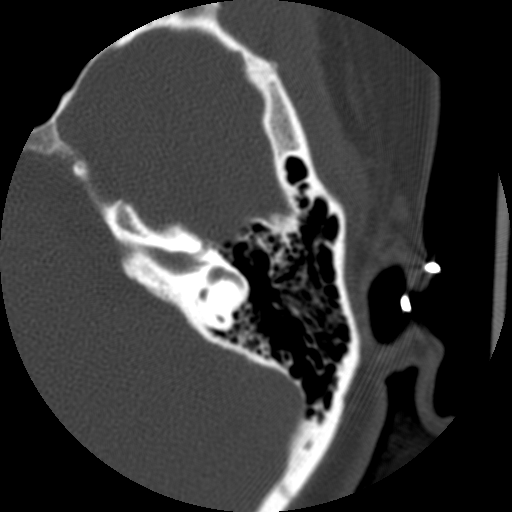
[im 114/152  bone]
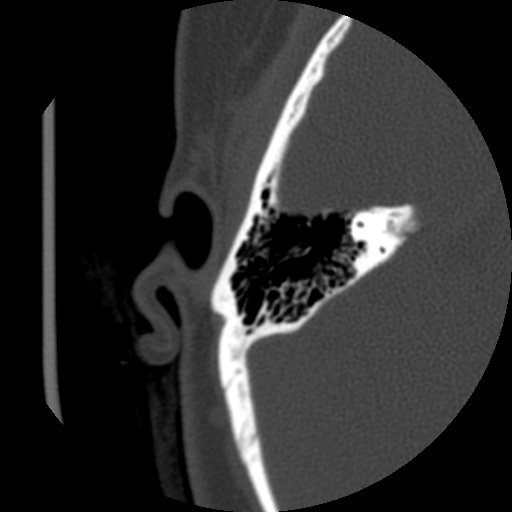
[im 133/152  bone]
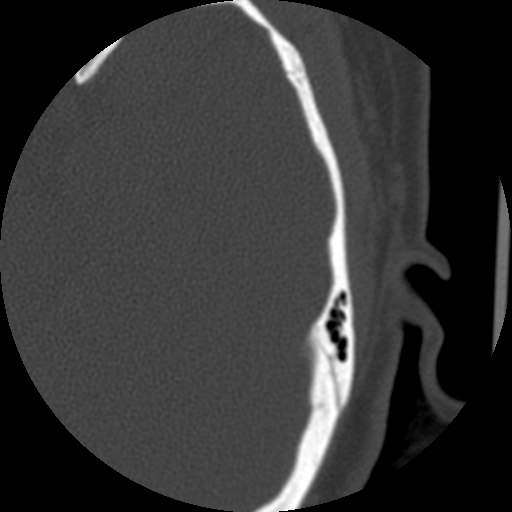

[Series 102: axial · axial · 0.39mm/px · z∈[+104,+127]mm · 3 of 75 slices shown (4 of 4)]
[im 19/75  bone]
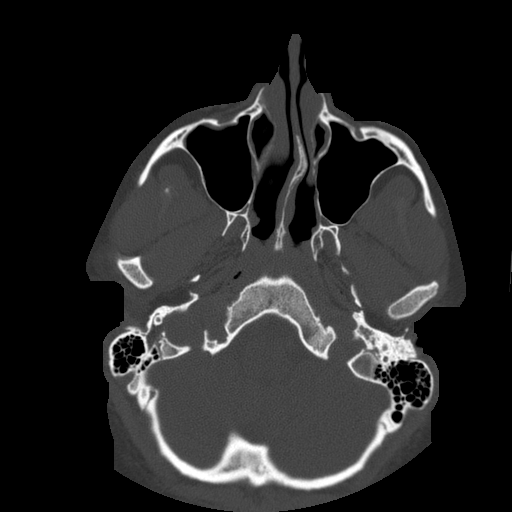
[im 38/75  bone]
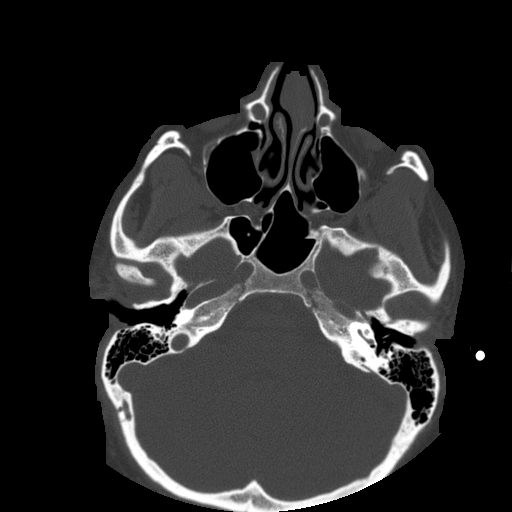
[im 56/75  bone]
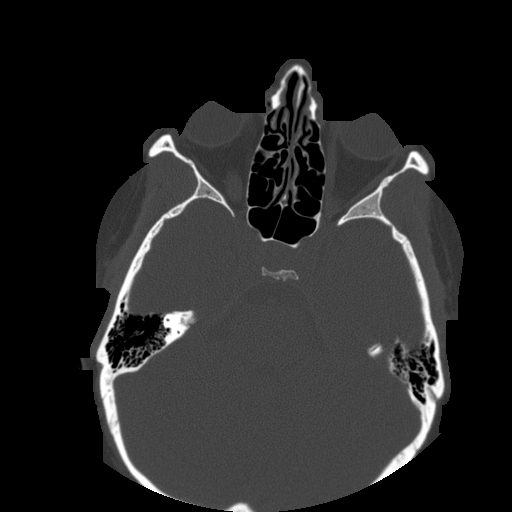

[16 of 30 positions shown; findings below may reference images not displayed]

FINDINGS: The temporal bones are unremarkable.  Mastoid air cells
are clear.  Middle ear cavities are clear.  The ossicles are
normally formed and articulating.  The inner ear structures are
within normal limits.  There is a high riding jugular bulb on the
right, unlikely be of clinical consequence to the patient.
Circumferential mucosal thickening is present at the inferior
aspect of right maxillary sinus with some wall thickening,
suggesting and element of chronic disease.  The remaining paranasal
sinuses are clear.  Limited imaging of the brain is unremarkable.
IMPRESSION: 1.  No significant or focal temporal bone pathology to explain the
patient's right-sided ear pain.
2.  Changes of chronic right maxillary sinus disease.

## 2010-08-27 ENCOUNTER — Emergency Department (INDEPENDENT_AMBULATORY_CARE_PROVIDER_SITE_OTHER): Payer: PRIVATE HEALTH INSURANCE

## 2010-08-27 ENCOUNTER — Emergency Department (HOSPITAL_BASED_OUTPATIENT_CLINIC_OR_DEPARTMENT_OTHER)
Admission: EM | Admit: 2010-08-27 | Discharge: 2010-08-27 | Disposition: A | Discharge status: Home / Self Care | Payer: PRIVATE HEALTH INSURANCE | Attending: Emergency Medicine | Admitting: Emergency Medicine

## 2010-08-27 ENCOUNTER — Encounter: Payer: Self-pay | Admitting: *Deleted

## 2010-08-27 DIAGNOSIS — R079 Chest pain, unspecified: Secondary | ICD-10-CM

## 2010-08-27 DIAGNOSIS — R109 Unspecified abdominal pain: Secondary | ICD-10-CM | POA: Insufficient documentation

## 2010-08-27 DIAGNOSIS — M62838 Other muscle spasm: Secondary | ICD-10-CM

## 2010-08-27 DIAGNOSIS — M5380 Other specified dorsopathies, site unspecified: Secondary | ICD-10-CM | POA: Insufficient documentation

## 2010-08-27 LAB — DIFFERENTIAL
Basophils Absolute: 0 10*3/uL (ref 0.0–0.1)
Lymphocytes Relative: 29 % (ref 12–46)
Monocytes Absolute: 0.6 10*3/uL (ref 0.1–1.0)
Neutro Abs: 5 10*3/uL (ref 1.7–7.7)

## 2010-08-27 LAB — CBC
HCT: 37.8 % (ref 36.0–46.0)
Hemoglobin: 13 g/dL (ref 12.0–15.0)
RDW: 12.1 % (ref 11.5–15.5)
WBC: 8.3 10*3/uL (ref 4.0–10.5)

## 2010-08-27 LAB — COMPREHENSIVE METABOLIC PANEL
ALT: 22 U/L (ref 0–35)
AST: 21 U/L (ref 0–37)
Alkaline Phosphatase: 81 U/L (ref 39–117)
CO2: 27 mEq/L (ref 19–32)
Chloride: 104 mEq/L (ref 96–112)
Creatinine, Ser: 0.8 mg/dL (ref 0.50–1.10)
GFR calc non Af Amer: >60 mL/min (ref >60)
Sodium: 138 mEq/L (ref 135–145)
Total Bilirubin: 0.4 mg/dL (ref 0.3–1.2)

## 2010-08-27 LAB — URINALYSIS, ROUTINE W REFLEX MICROSCOPIC
Ketones, ur: NEGATIVE mg/dL
Nitrite: NEGATIVE
Specific Gravity, Urine: 1.026 (ref 1.005–1.030)
Urobilinogen, UA: 0.2 mg/dL (ref 0.0–1.0)
pH: 7.5 (ref 5.0–8.0)

## 2010-08-27 LAB — D-DIMER, QUANTITATIVE: D-Dimer, Quant: <0.22 ug/mL-FEU (ref 0.00–0.48)

## 2010-08-27 LAB — URINE MICROSCOPIC-ADD ON

## 2010-08-27 MED ORDER — HYDROCODONE-ACETAMINOPHEN 5-325 MG PO TABS: 2.0000 | ORAL_TABLET | ORAL | Status: AC | PRN

## 2010-08-27 MED ORDER — IBUPROFEN 800 MG PO TABS: 800.0000 mg | ORAL_TABLET | Freq: Three times a day (TID) | ORAL | Status: AC

## 2010-08-27 NOTE — ED Notes (Signed)
Pt c/o left flank and lower back pain x 1 week. ? Injury with heavy lifting

## 2010-08-27 NOTE — ED Provider Notes (Signed)
History     CSN: [id number] Arrival date & time: 08/27/2010  3:14 PM  Chief Complaint  Patient presents with  . Flank Pain  . Back Pain   Patient is a 30 y.o. female presenting with back pain.  Back Pain  This is a new problem. The current episode started 2 days ago. The problem occurs constantly. The problem has been gradually worsening. The pain is associated with no known injury. The pain is present in the thoracic spine. The quality of the pain is described as stabbing and shooting. The pain is at a severity of 5/10. The pain is moderate. The symptoms are aggravated by bending, twisting and certain positions. The pain is the same all the time. Associated symptoms include chest pain and abdominal pain. Pertinent negatives include no fever, no numbness, no pelvic pain, no leg pain, no paresthesias, no paresis, no tingling and no weakness. She has tried nothing for the symptoms. Risk factors: no pe risk.  Pt complains of soreness in her left upper back that radiates around to her left side.  Pt reports she feels like she has muscle spasm.  Pt's friend told her she could have a blood clot so she became concerned and came in for evaluation.  History reviewed. No pertinent past medical history.  Past Surgical History  Procedure Date  . Joint replacement   . Cesarean section     History reviewed. No pertinent family history.  History  Substance Use Topics  . Smoking status: Never Smoker   . Smokeless tobacco: Not on file  . Alcohol Use: No    OB History    Grav Para Term Preterm Abortions TAB SAB Ect Mult Living                  Review of Systems  Constitutional: Negative for fever.  Cardiovascular: Positive for chest pain.  Gastrointestinal: Positive for abdominal pain.  Genitourinary: Negative for pelvic pain.  Musculoskeletal: Positive for back pain.  Neurological: Negative for tingling, weakness, numbness and paresthesias.  All other systems reviewed and are  negative.    Physical Exam  BP 135/92  Pulse 98  Temp(Src) 98.1 F (36.7 C) (Oral)  Resp 16  Wt 260 lb (117.935 kg)  SpO2 100%  LMP 08/20/2010  Physical Exam  Nursing note and vitals reviewed. Constitutional: She is oriented to person, place, and time. She appears well-developed and well-nourished.  HENT:  Head: Normocephalic and atraumatic.  Eyes: Conjunctivae and EOM are normal. Pupils are equal, round, and reactive to light.  Neck: Normal range of motion.  Cardiovascular: Normal rate and regular rhythm.   Pulmonary/Chest: Effort normal and breath sounds normal.  Abdominal: Soft. Bowel sounds are normal.  Musculoskeletal: Normal range of motion.  Neurological: She is alert and oriented to person, place, and time. She has normal reflexes.  Skin: Skin is warm and dry.  Psychiatric: She has a normal mood and affect.    ED Course  Procedures  MDM chest xray normal,  Labs normal,  Pt advised to follow up with her primary MD for recheck.  Ibuprofen and vicodin for pain.      Langston Masker, [place] 08/27/10 1711

## 2010-08-28 NOTE — ED Provider Notes (Signed)
Medical screening examination/treatment/procedure(s) were performed by non-physician practitioner and as supervising physician I was immediately available for consultation/collaboration.   Izick Gasbarro M Antonieta Slaven, MD 08/28/10 0009 

## 2010-10-02 LAB — D-DIMER, QUANTITATIVE: D-Dimer, Quant: 0.26

## 2010-10-20 LAB — D-DIMER, QUANTITATIVE: D-Dimer, Quant: 0.25

## 2010-10-20 LAB — POCT CARDIAC MARKERS
CKMB, poc: <1 — ABNORMAL LOW
Troponin i, poc: <0.05

## 2010-10-20 LAB — I-STAT 8, (EC8 V) (CONVERTED LAB)
Acid-base deficit: 2
Bicarbonate: 23
Glucose, Bld: 121 — ABNORMAL HIGH
Potassium: 3.9
TCO2: 24
pCO2, Ven: 37.6 — ABNORMAL LOW
pH, Ven: 7.394 — ABNORMAL HIGH

## 2010-10-20 LAB — TSH: TSH: 2.392

## 2010-10-20 LAB — POCT I-STAT CREATININE
Creatinine, Ser: 1
Operator id: 294501

## 2010-11-18 ENCOUNTER — Encounter (HOSPITAL_BASED_OUTPATIENT_CLINIC_OR_DEPARTMENT_OTHER): Payer: Self-pay | Admitting: *Deleted

## 2010-11-18 ENCOUNTER — Emergency Department (HOSPITAL_BASED_OUTPATIENT_CLINIC_OR_DEPARTMENT_OTHER)
Admission: EM | Admit: 2010-11-18 | Discharge: 2010-11-18 | Disposition: A | Discharge status: Home / Self Care | Payer: PRIVATE HEALTH INSURANCE | Attending: Emergency Medicine | Admitting: Emergency Medicine

## 2010-11-18 ENCOUNTER — Emergency Department (INDEPENDENT_AMBULATORY_CARE_PROVIDER_SITE_OTHER): Payer: PRIVATE HEALTH INSURANCE

## 2010-11-18 ENCOUNTER — Other Ambulatory Visit: Payer: Self-pay

## 2010-11-18 DIAGNOSIS — R079 Chest pain, unspecified: Secondary | ICD-10-CM | POA: Insufficient documentation

## 2010-11-18 DIAGNOSIS — R0602 Shortness of breath: Secondary | ICD-10-CM

## 2010-11-18 DIAGNOSIS — R0789 Other chest pain: Secondary | ICD-10-CM

## 2010-11-18 DIAGNOSIS — I10 Essential (primary) hypertension: Secondary | ICD-10-CM | POA: Insufficient documentation

## 2010-11-18 DIAGNOSIS — B9789 Other viral agents as the cause of diseases classified elsewhere: Secondary | ICD-10-CM | POA: Insufficient documentation

## 2010-11-18 DIAGNOSIS — B349 Viral infection, unspecified: Secondary | ICD-10-CM

## 2010-11-18 DIAGNOSIS — M79609 Pain in unspecified limb: Secondary | ICD-10-CM

## 2010-11-18 DIAGNOSIS — R209 Unspecified disturbances of skin sensation: Secondary | ICD-10-CM

## 2010-11-18 HISTORY — DX: Essential (primary) hypertension: I10

## 2010-11-18 LAB — BASIC METABOLIC PANEL
BUN: 17 mg/dL (ref 6–23)
CO2: 26 mEq/L (ref 19–32)
Calcium: 10.2 mg/dL (ref 8.4–10.5)
Chloride: 105 mEq/L (ref 96–112)
Creatinine, Ser: 0.8 mg/dL (ref 0.50–1.10)
Glucose, Bld: 114 mg/dL — ABNORMAL HIGH (ref 70–99)

## 2010-11-18 LAB — CBC
HCT: 37.6 % (ref 36.0–46.0)
Hemoglobin: 13 g/dL (ref 12.0–15.0)
MCV: 87.9 fL (ref 78.0–100.0)
RDW: 12.1 % (ref 11.5–15.5)
WBC: 9.3 10*3/uL (ref 4.0–10.5)

## 2010-11-18 LAB — DIFFERENTIAL
Basophils Absolute: 0 10*3/uL (ref 0.0–0.1)
Eosinophils Relative: 3 % (ref 0–5)
Lymphocytes Relative: 32 % (ref 12–46)
Lymphs Abs: 3 10*3/uL (ref 0.7–4.0)
Monocytes Absolute: 0.8 10*3/uL (ref 0.1–1.0)
Monocytes Relative: 9 % (ref 3–12)

## 2010-11-18 MED ORDER — IBUPROFEN 800 MG PO TABS: 800.0000 mg | ORAL_TABLET | Freq: Three times a day (TID) | ORAL | Status: AC

## 2010-11-18 MED ORDER — ACETAMINOPHEN-CODEINE 120-12 MG/5ML PO SUSP: 5.0000 mL | Freq: Three times a day (TID) | ORAL | Status: AC | PRN

## 2010-11-18 NOTE — ED Notes (Signed)
Pt states that she has been having bilateral arm pain x1 week with some numbness. Pt has also been treated in the past for musculoskeletal CP but tonight had tightness that woke her from sleep. States she was having a difficult time catching her breath but was not diaphoretic. Pt has also had a productive cough x 1 week.

## 2010-11-18 NOTE — ED Provider Notes (Signed)
History     CSN: [id number] Arrival date & time: 11/18/2010  2:01 AM   First MD Initiated Contact with Patient 11/18/10 [phone]      Chief Complaint  Patient presents with  . Chest Pain    (Consider location/radiation/quality/duration/timing/severity/associated sxs/prior treatment) Patient is a 30 y.o. female presenting with chest pain. The history is provided by the patient.  Chest Pain The chest pain began 3 - 5 hours ago. Duration of episode(s) is 30 minutes. Chest pain occurs constantly. The chest pain is resolved. Associated with: Movement and worse with lifting children at home. The severity of the pain is moderate. The quality of the pain is described as aching, sharp and similar to previous episodes. The pain does not radiate. Primary symptoms include cough. Pertinent negatives for primary symptoms include no fever, no fatigue, no syncope, no shortness of breath, no wheezing, no abdominal pain, no nausea and no vomiting.  Pertinent negatives for associated symptoms include no diaphoresis, no lower extremity edema, no near-syncope, no orthopnea and no weakness. She tried nothing for the symptoms. Risk factors include no known risk factors.  Pertinent negatives for past medical history include no CAD.    having some bilateral arm pains and chest pains last week now. She attributes her arm pains lifting her children at home. Today she woke up with pain is worse and she became concerned. She currently has a sinus infection with some congestion and some dry cough. No fevers or chills. No rash and no shortness of breath. She has not tried medications. She has tried albuterol in the remote past and it makes her heart rate elevate and makes her feel terrible, so she declines any at this time. She is not having wheezing. No leg pain or swelling or history of DVT. Her recent surgery. Nonsmoker. No recent drug use or change in medications.  Past Medical History  Diagnosis Date  . Hypertension      Past Surgical History  Procedure Date  . Joint replacement   . Cesarean section     History reviewed. No pertinent family history.  History  Substance Use Topics  . Smoking status: Never Smoker   . Smokeless tobacco: Not on file  . Alcohol Use: No    OB History    Grav Para Term Preterm Abortions TAB SAB Ect Mult Living                  Review of Systems  Constitutional: Negative for fever, chills, diaphoresis and fatigue.  HENT: Negative for neck pain and neck stiffness.   Eyes: Negative for pain.  Respiratory: Positive for cough. Negative for shortness of breath and wheezing.   Cardiovascular: Positive for chest pain. Negative for orthopnea, syncope and near-syncope.  Gastrointestinal: Negative for nausea, vomiting and abdominal pain.  Genitourinary: Negative for dysuria.  Musculoskeletal: Negative for back pain.  Skin: Negative for rash.  Neurological: Negative for weakness and headaches.  All other systems reviewed and are negative.    Allergies  Chlorhexidine; Sulfonamide derivatives; and Penicillins  Home Medications   Current Outpatient Rx  Name Route Sig Dispense Refill  . CALCIUM CARBONATE ANTACID 500 MG PO CHEW Oral Chew 1 tablet by mouth daily.        BP 145/82  Pulse 92  Temp(Src) 97.9 F (36.6 C) (Oral)  Resp 19  Ht 5\' 9"  (1.753 m)  Wt 265 lb (120.203 kg)  BMI 39.13 kg/m2  SpO2 100%  LMP 11/03/2010  Physical Exam  Constitutional:  She is oriented to person, place, and time. She appears well-developed and well-nourished.  HENT:  Head: Normocephalic and atraumatic.       Hoarse voice and nasal congestion  Eyes: Conjunctivae and EOM are normal. Pupils are equal, round, and reactive to light.  Neck: Trachea normal. Neck supple. No thyromegaly present.  Cardiovascular: Normal rate, regular rhythm, S1 normal, S2 normal and normal pulses.     No systolic murmur is present   No diastolic murmur is present  Pulses:      Radial pulses are  2+ on the right side, and 2+ on the left side.  Pulmonary/Chest: Effort normal and breath sounds normal. She has no wheezes. She has no rhonchi. She has no rales. She exhibits no tenderness.  Abdominal: Soft. Normal appearance and bowel sounds are normal. There is no tenderness. There is no CVA tenderness and negative Murphy's sign.  Musculoskeletal:       BLE:s Calves nontender, no cords or erythema, negative Homans sign  Neurological: She is alert and oriented to person, place, and time. She has normal strength. No cranial nerve deficit or sensory deficit. GCS eye subscore is 4. GCS verbal subscore is 5. GCS motor subscore is 6.  Skin: Skin is warm and dry. No rash noted. She is not diaphoretic.  Psychiatric: Her speech is normal.       Cooperative and appropriate    ED Course  Procedures (including critical care time)  Results for orders placed during the hospital encounter of 11/18/10  CBC      Component Value Range   WBC 9.3  4.0 - 10.5 (K/uL)   RBC 4.28  3.87 - 5.11 (MIL/uL)   Hemoglobin 13.0  12.0 - 15.0 (g/dL)   HCT 40.9  81.1 - 91.4 (%)   MCV 87.9  78.0 - 100.0 (fL)   MCH 30.4  26.0 - 34.0 (pg)   MCHC 34.6  30.0 - 36.0 (g/dL)   RDW 78.2  95.6 - 21.3 (%)   Platelets 334  150 - 400 (K/uL)  DIFFERENTIAL      Component Value Range   Neutrophils Relative 56  43 - 77 (%)   Neutro Abs 5.2  1.7 - 7.7 (K/uL)   Lymphocytes Relative 32  12 - 46 (%)   Lymphs Abs 3.0  0.7 - 4.0 (K/uL)   Monocytes Relative 9  3 - 12 (%)   Monocytes Absolute 0.8  0.1 - 1.0 (K/uL)   Eosinophils Relative 3  0 - 5 (%)   Eosinophils Absolute 0.3  0.0 - 0.7 (K/uL)   Basophils Relative 0  0 - 1 (%)   Basophils Absolute 0.0  0.0 - 0.1 (K/uL)  BASIC METABOLIC PANEL      Component Value Range   Sodium 140  135 - 145 (mEq/L)   Potassium 3.9  3.5 - 5.1 (mEq/L)   Chloride 105  96 - 112 (mEq/L)   CO2 26  19 - 32 (mEq/L)   Glucose, Bld 114 (*) 70 - 99 (mg/dL)   BUN 17  6 - 23 (mg/dL)   Creatinine, Ser 0.86   0.50 - 1.10 (mg/dL)   Calcium 57.8  8.4 - 10.5 (mg/dL)   GFR calc non Af Amer >90  >90 (mL/min)   GFR calc Af Amer >90  >90 (mL/min)  D-DIMER, QUANTITATIVE      Component Value Range   D-Dimer, Quant <0.22  0.00 - 0.48 (ug/mL-FEU)   Dg Chest 2 View  11/18/2010  *RADIOLOGY  REPORT*  Clinical Data: Bilateral arm pain, chest tightness and shortness of breath; arm numbness.  Productive cough for 1 week.  CHEST - 2 VIEW  Comparison: Chest radiograph performed 08/27/2010  Findings: The lungs are well-aerated and clear.  There is no evidence of focal opacification, pleural effusion or pneumothorax.  The heart is normal in size; the mediastinal contour is within normal limits.  No acute osseous abnormalities are seen.  IMPRESSION: No acute cardiopulmonary process seen.  Original Report Authenticated By: Tonia Ghent, M.D.    Date: 11/18/2010  Rate: 98  Rhythm: normal sinus rhythm  QRS Axis: normal  Intervals: normal  ST/T Wave abnormalities: nonspecific ST changes  Conduction Disutrbances:none  Narrative Interpretation:   Old EKG Reviewed: none available       MDM  Cough and chest discomfort with symptoms of viral URI. Chest x-ray labs and EKG obtained and reviewed as above. Patient declines albuterol treatment and attempt for symptomatic relief. Low pretest probability for PE and negative d-dimer, doubt the same. Plan Motrin and prescription for Tylenol with codeine for cough as needed at nighttime. Work note provided. Viral precautions and stable for discharge home        Sunnie Nielsen, MD 11/18/10 [phone]

## 2010-11-18 NOTE — ED Notes (Signed)
MD at bedside. 

## 2010-11-18 NOTE — ED Notes (Signed)
Pt c/o recurring CP that woke from sleep tonight and felt though she couldn't catch her breath, denies any diaphoresis.

## 2010-11-18 NOTE — ED Notes (Signed)
Pt returned from radiology, remains ST on monitor, NAD noted.

## 2010-12-17 ENCOUNTER — Emergency Department (INDEPENDENT_AMBULATORY_CARE_PROVIDER_SITE_OTHER): Payer: PRIVATE HEALTH INSURANCE

## 2010-12-17 ENCOUNTER — Emergency Department (HOSPITAL_BASED_OUTPATIENT_CLINIC_OR_DEPARTMENT_OTHER)
Admission: EM | Admit: 2010-12-17 | Discharge: 2010-12-17 | Disposition: A | Discharge status: Home / Self Care | Payer: PRIVATE HEALTH INSURANCE | Attending: Emergency Medicine | Admitting: Emergency Medicine

## 2010-12-17 ENCOUNTER — Encounter (HOSPITAL_BASED_OUTPATIENT_CLINIC_OR_DEPARTMENT_OTHER): Payer: Self-pay | Admitting: Family Medicine

## 2010-12-17 DIAGNOSIS — K219 Gastro-esophageal reflux disease without esophagitis: Secondary | ICD-10-CM

## 2010-12-17 DIAGNOSIS — R1012 Left upper quadrant pain: Secondary | ICD-10-CM | POA: Insufficient documentation

## 2010-12-17 DIAGNOSIS — R079 Chest pain, unspecified: Secondary | ICD-10-CM

## 2010-12-17 DIAGNOSIS — R0789 Other chest pain: Secondary | ICD-10-CM

## 2010-12-17 DIAGNOSIS — R110 Nausea: Secondary | ICD-10-CM

## 2010-12-17 DIAGNOSIS — I10 Essential (primary) hypertension: Secondary | ICD-10-CM | POA: Insufficient documentation

## 2010-12-17 DIAGNOSIS — R071 Chest pain on breathing: Secondary | ICD-10-CM | POA: Insufficient documentation

## 2010-12-17 NOTE — ED Notes (Signed)
Pt c/o LUQ pain x 1 wk. Pt reports normal bowel movements. Pt denies shob, vomiting.

## 2010-12-17 NOTE — ED Provider Notes (Signed)
History     CSN: [id number] Arrival date & time: 12/17/2010 10:08 AM   First MD Initiated Contact with Patient 12/17/10 1017      Chief Complaint  Patient presents with  . Abdominal Pain    (Consider location/radiation/quality/duration/timing/severity/associated sxs/prior treatment) Patient is a 30 y.o. female presenting with abdominal pain and chest pain. The history is provided by the patient.  Abdominal Pain The primary symptoms of the illness include abdominal pain and diarrhea. The primary symptoms of the illness do not include shortness of breath, nausea or vomiting. Primary symptoms comment: Chest pain The current episode started more than 2 days ago. The onset of the illness was gradual. The problem has been gradually worsening.  The abdominal pain began more than 2 days ago. The pain came on gradually. Progression: Waxing and waning. The abdominal pain is located in the epigastric region and LUQ. The abdominal pain does not radiate. The severity of the abdominal pain is 5/10. The abdominal pain is relieved by nothing. Exacerbated by: Made eating makes it worse.  The illness is associated with NSAID use and eating. The patient states that she believes she is currently not pregnant. The patient has not had a change in bowel habit. Symptoms associated with the illness do not include anorexia, diaphoresis, constipation, hematuria or back pain. Significant associated medical issues include GERD.  Chest Pain The chest pain began 2 days ago. Chest pain occurs constantly. The chest pain is unchanged. The pain is associated with breathing and coughing (Palpation). At its most intense, the pain is at 9/10. The pain is currently at 5/10. The severity of the pain is moderate. The quality of the pain is described as sharp and stabbing. The pain does not radiate. Chest pain is worsened by certain positions and deep breathing. Primary symptoms include abdominal pain. Pertinent negatives for primary  symptoms include no shortness of breath, no nausea and no vomiting. Primary symptoms comment: Chest pain  Pertinent negatives for associated symptoms include no diaphoresis.     Past Medical History  Diagnosis Date  . Hypertension     Past Surgical History  Procedure Date  . Joint replacement   . Cesarean section     No family history on file.  History  Substance Use Topics  . Smoking status: Never Smoker   . Smokeless tobacco: Not on file  . Alcohol Use: No    OB History    Grav Para Term Preterm Abortions TAB SAB Ect Mult Living                  Review of Systems  Constitutional: Negative for diaphoresis.  Respiratory: Negative for shortness of breath.   Cardiovascular: Positive for chest pain.  Gastrointestinal: Positive for abdominal pain and diarrhea. Negative for nausea, vomiting, constipation and anorexia.  Genitourinary: Negative for hematuria.  Musculoskeletal: Negative for back pain.  All other systems reviewed and are negative.    Allergies  Chlorhexidine; Sulfonamide derivatives; Zithromax; and Penicillins  Home Medications   Current Outpatient Rx  Name Route Sig Dispense Refill  . CALCIUM CARBONATE ANTACID 500 MG PO CHEW Oral Chew 1 tablet by mouth daily.        BP 135/98  Pulse 102  Temp(Src) 98.1 F (36.7 C) (Oral)  Resp 17  Ht 5\' 9"  (1.753 m)  Wt 265 lb (120.203 kg)  BMI 39.13 kg/m2  SpO2 100%  LMP 12/06/2010  Physical Exam  Nursing note and vitals reviewed. Constitutional: She is oriented to person,  place, and time. She appears well-developed and well-nourished. No distress.  HENT:  Head: Normocephalic and atraumatic.  Eyes: EOM are normal. Pupils are equal, round, and reactive to light.  Cardiovascular: Normal rate, regular rhythm, normal heart sounds and intact distal pulses.  Exam reveals no friction rub.   No murmur heard. Pulmonary/Chest: Effort normal and breath sounds normal. She has no wheezes. She has no rales. She  exhibits tenderness.    Abdominal: Soft. Bowel sounds are normal. She exhibits no distension. There is no tenderness. There is no rebound and no guarding.  Musculoskeletal: Normal range of motion. She exhibits no tenderness.       No edema  Neurological: She is alert and oriented to person, place, and time. No cranial nerve deficit.  Skin: Skin is warm and dry. No rash noted.  Psychiatric: She has a normal mood and affect. Her behavior is normal.    ED Course  Procedures (including critical care time)  Labs Reviewed - No data to display Dg Chest 2 View  12/17/2010  *RADIOLOGY REPORT*  Clinical Data: Chest pain  CHEST - 2 VIEW  Comparison: 11/18/2010  Findings: Cardiomediastinal silhouette is stable.  No acute infiltrate or pleural effusion.  No pulmonary edema.  Bony thorax is stable.  IMPRESSION: No active disease.  Original Report Authenticated By: Natasha Mead, M.D.   Dg Abd 1 View  12/17/2010  *RADIOLOGY REPORT*  Clinical Data: Left upper quadrant pain.  Lower chest pain. Nausea.  ABDOMEN - 1 VIEW  Comparison: None.  Findings: Gas and stool are scattered in the colon.  No small bowel dilatation.  IMPRESSION: No acute findings.  Original Report Authenticated By: Reyes Ivan, M.D.    Date: 12/17/2010  Rate: 87  Rhythm: normal sinus rhythm  QRS Axis: normal  Intervals: normal  ST/T Wave abnormalities: nonspecific ST/T changes  Conduction Disutrbances:nonspecific intraventricular conduction delay incomplete right bundle branch block  Narrative Interpretation:   Old EKG Reviewed: unchanged    No diagnosis found.    MDM   Patient initially presented stating she had left upper quadrant abdominal pain however on exam it appears to be left  rib pain which is tender to palpation and mildly worse with deep breathing and coughing. Patient denies any infectious symptoms her upper respiratory symptoms. However she does have 3 children and is constantly carrying a child on the left  side of her car seat. It is worse with moving her arm and palpating the left ribs around to the back. She has absolutely no abdominal pain today however she says intermittently her stomach feels like it's in knots and occasionally will have diarrhea. She denies any constipation, vomiting, nausea. She recently had her menses and denies being pregnant.   the patient has a cardiologist that she's been seeing yearly since her 89s due to an incomplete right bundle branch block on her EKG and has had normal echoes every year. Her symptoms do not appear to be cardiac in nature. She has no risk factors for PE. Feel most likely her abdominal symptoms are due to GERD and she has a history and is taking a large amount of ibuprofen.    EKG unchanged and chest x-ray and KUB within normal limits. Patient has a history of musculoskeletal chest pain appeared that this is the same. Will treat with Tylenol and have her follow up with her cardiologist whom she sees on the 20th of this month. Will get a GI.  Gwyneth Sprout, MD 12/17/10 1220

## 2010-12-24 ENCOUNTER — Encounter: Payer: Self-pay | Admitting: *Deleted

## 2010-12-24 ENCOUNTER — Encounter: Payer: Self-pay | Admitting: Cardiovascular Disease

## 2010-12-25 ENCOUNTER — Ambulatory Visit: Payer: PRIVATE HEALTH INSURANCE | Admitting: Cardiovascular Disease

## 2011-01-20 ENCOUNTER — Ambulatory Visit (INDEPENDENT_AMBULATORY_CARE_PROVIDER_SITE_OTHER): Payer: PRIVATE HEALTH INSURANCE | Admitting: Cardiovascular Disease

## 2011-01-20 ENCOUNTER — Encounter: Payer: Self-pay | Admitting: Cardiovascular Disease

## 2011-01-20 ENCOUNTER — Ambulatory Visit: Payer: PRIVATE HEALTH INSURANCE | Admitting: Cardiovascular Disease

## 2011-01-20 DIAGNOSIS — I10 Essential (primary) hypertension: Secondary | ICD-10-CM

## 2011-01-20 DIAGNOSIS — Z0000 Encounter for general adult medical examination without abnormal findings: Secondary | ICD-10-CM

## 2011-01-20 DIAGNOSIS — E7800 Pure hypercholesterolemia, unspecified: Secondary | ICD-10-CM

## 2011-01-20 DIAGNOSIS — R002 Palpitations: Secondary | ICD-10-CM

## 2011-01-20 DIAGNOSIS — F411 Generalized anxiety disorder: Secondary | ICD-10-CM

## 2011-01-20 DIAGNOSIS — R079 Chest pain, unspecified: Secondary | ICD-10-CM

## 2011-01-20 NOTE — Assessment & Plan Note (Signed)
I think this plays a role with Laci.  I wonder if cymbalta would be a good drug for her aches and pains.  Would need to be started by primary MD

## 2011-01-20 NOTE — Patient Instructions (Signed)
Your physician recommends that you schedule a follow-up appointment in: AS NEEDED Your physician recommends that you continue on your current medications as directed. Please refer to the Current Medication list given to you today. Your physician recommends that you return for lab work in: BMET  LIPID LIVER TSH  SED RATE  DX V70.0 Your physician has requested that you have a stress echocardiogram. For further information please visit [email or web address] Please follow instruction sheet as given. DX 785.1

## 2011-01-20 NOTE — Assessment & Plan Note (Signed)
She really does not have primary  Check fasting lipids.  Poor diet discussed

## 2011-01-20 NOTE — Assessment & Plan Note (Signed)
Atypical history of pericarditis.  Labs and F/U stress echo

## 2011-01-20 NOTE — Progress Notes (Signed)
Annette Hoffman is seen today for F/U of SSCP. She has had atypical pain in the past with negative w.u. She has a distant history of pericarditis. She had a normal stress echo in September of 2010. She has had a URI and atypical SSCP. the pain is a spasm in her chest. She has left arm pain probably muscular from carrying her 93 month old She now has 3 kids 10 yrs or less at home.  She is working in [employer] at [employer]  She is left handed. She has had a cough and nasal congestin but no ferver or sputum. Her ECG is normal 12/12. She continues to be overweight. We discussed goals of wieght loss and the [employer] as she has a lot of pop tarts and pasta.   ROS: Denies fever, malais, weight loss, blurry vision, decreased visual acuity, cough, sputum, SOB, hemoptysis, pleuritic pain, palpitaitons, heartburn, abdominal pain, melena, lower extremity edema, claudication, or rash.  All other systems reviewed and negative  General: Affect appropriate Healthy:  appears stated age HEENT: normal Neck supple with no adenopathy JVP normal no bruits no thyromegaly Lungs clear with no wheezing and good diaphragmatic motion Heart:  S1/S2 no murmur,rub, gallop or click PMI normal Abdomen: benighn, BS positve, no tenderness, no AAA no bruit.  No HSM or HJR Distal pulses intact with no bruits No edema Neuro non-focal Skin warm and dry No muscular weakness   Current Outpatient Prescriptions  Medication Sig Dispense Refill  . calcium carbonate (TUMS - DOSED IN MG ELEMENTAL CALCIUM) 500 MG chewable tablet Chew 1 tablet by mouth daily.          Allergies  Chlorhexidine; Sulfonamide derivatives; Zithromax; and Penicillins  Electrocardiogram: 12/12  NSR normal ecg no evidence of pericarditis  Assessment and Plan

## 2011-01-20 NOTE — Assessment & Plan Note (Signed)
Borderline related to weight gain, diet and being sedentary.  See what BP does with exercise and monitor at home

## 2011-01-21 ENCOUNTER — Ambulatory Visit: Payer: PRIVATE HEALTH INSURANCE | Admitting: Cardiovascular Disease

## 2011-01-23 ENCOUNTER — Ambulatory Visit (HOSPITAL_BASED_OUTPATIENT_CLINIC_OR_DEPARTMENT_OTHER): Payer: PRIVATE HEALTH INSURANCE | Admitting: Radiology

## 2011-01-23 ENCOUNTER — Ambulatory Visit (HOSPITAL_COMMUNITY): Payer: PRIVATE HEALTH INSURANCE | Attending: Cardiovascular Disease | Admitting: Radiology

## 2011-01-23 DIAGNOSIS — E785 Hyperlipidemia, unspecified: Secondary | ICD-10-CM | POA: Insufficient documentation

## 2011-01-23 DIAGNOSIS — R072 Precordial pain: Secondary | ICD-10-CM

## 2011-01-23 DIAGNOSIS — R0989 Other specified symptoms and signs involving the circulatory and respiratory systems: Secondary | ICD-10-CM

## 2011-01-23 DIAGNOSIS — I10 Essential (primary) hypertension: Secondary | ICD-10-CM | POA: Insufficient documentation

## 2011-01-23 DIAGNOSIS — R002 Palpitations: Secondary | ICD-10-CM

## 2011-01-23 DIAGNOSIS — R079 Chest pain, unspecified: Secondary | ICD-10-CM | POA: Insufficient documentation

## 2011-01-23 DIAGNOSIS — Z6839 Body mass index (BMI) 39.0-39.9, adult: Secondary | ICD-10-CM | POA: Insufficient documentation

## 2011-01-26 ENCOUNTER — Other Ambulatory Visit (INDEPENDENT_AMBULATORY_CARE_PROVIDER_SITE_OTHER): Payer: PRIVATE HEALTH INSURANCE | Admitting: *Deleted

## 2011-01-26 DIAGNOSIS — Z0000 Encounter for general adult medical examination without abnormal findings: Secondary | ICD-10-CM

## 2011-01-26 DIAGNOSIS — R002 Palpitations: Secondary | ICD-10-CM

## 2011-01-26 LAB — LIPID PANEL
Cholesterol: 172 mg/dL (ref 0–200)
HDL: 43.2 mg/dL (ref >39.00)
LDL Cholesterol: 115 mg/dL — ABNORMAL HIGH (ref 0–99)
Total CHOL/HDL Ratio: 4
Triglycerides: 70 mg/dL (ref 0.0–149.0)
VLDL: 14 mg/dL (ref 0.0–40.0)

## 2011-01-26 LAB — HEPATIC FUNCTION PANEL
ALT: 20 U/L (ref 0–35)
AST: 21 U/L (ref 0–37)
Albumin: 4.3 g/dL (ref 3.5–5.2)
Alkaline Phosphatase: 55 U/L (ref 39–117)
Bilirubin, Direct: 0 mg/dL (ref 0.0–0.3)
Total Bilirubin: 0.5 mg/dL (ref 0.3–1.2)
Total Protein: 6.8 g/dL (ref 6.0–8.3)

## 2011-01-26 LAB — BASIC METABOLIC PANEL
BUN: 12 mg/dL (ref 6–23)
CO2: 25 mEq/L (ref 19–32)
Calcium: 9 mg/dL (ref 8.4–10.5)
Chloride: 105 mEq/L (ref 96–112)
Creatinine, Ser: 0.9 mg/dL (ref 0.4–1.2)
Glucose, Bld: 89 mg/dL (ref 70–99)

## 2011-01-26 LAB — SEDIMENTATION RATE: Sed Rate: 5 mm/hr (ref 0–22)

## 2011-01-27 ENCOUNTER — Other Ambulatory Visit: Payer: PRIVATE HEALTH INSURANCE | Admitting: *Deleted

## 2011-01-28 ENCOUNTER — Telehealth: Payer: Self-pay | Admitting: Cardiovascular Disease

## 2011-01-28 NOTE — Telephone Encounter (Signed)
PT AWARE OF STRESS ECHO RESULTS AND LABS .Annette Hoffman

## 2011-01-28 NOTE — Telephone Encounter (Signed)
New Msg: Pt calling wanting to know results of pt stress ECHO. Please return pt call to discuss further.

## 2012-11-24 IMAGING — CT CT HEAD W/O CM
1 series · 16 of 30 positions shown, 20 images · non-contrast
Comparison: 04/02/2009 and earlier.

CLINICAL DATA: 30-year-old female with headache, numbness, abnormal
sensation at the face.

CT HEAD WITHOUT CONTRAST
TECHNIQUE: Contiguous axial images were obtained from the base of
the skull through the vertex without contrast.

[Series 2: head 4.8 h37s · axial · 0.45mm/px · z∈[-108,+52]mm · 16 of 36 slices shown, 20 images]
[im 2/36  brain]
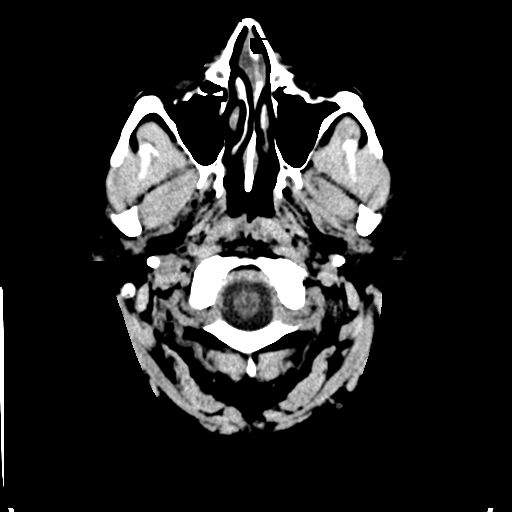
[im 2/36  bone]
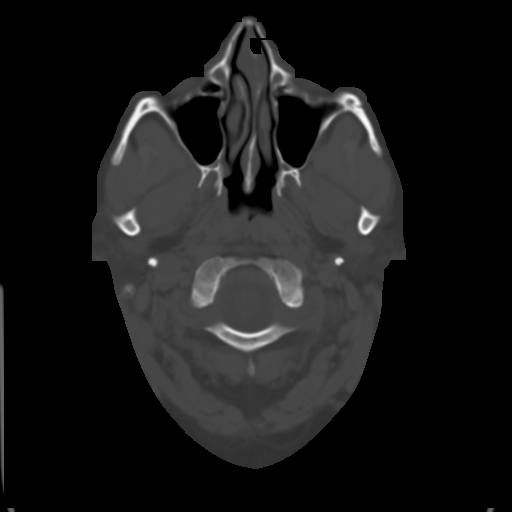
[im 4/36  brain]
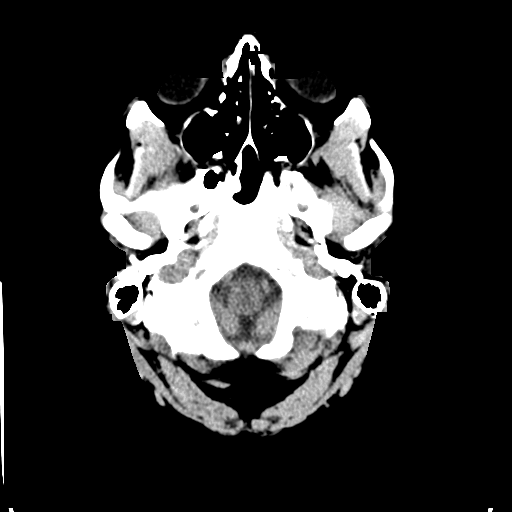
[im 7/36  brain]
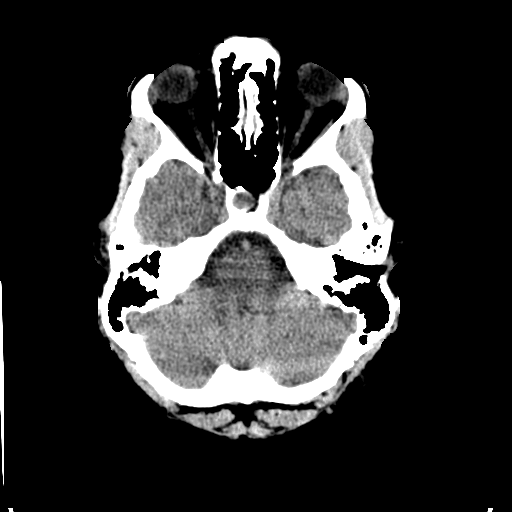
[im 9/36  brain]
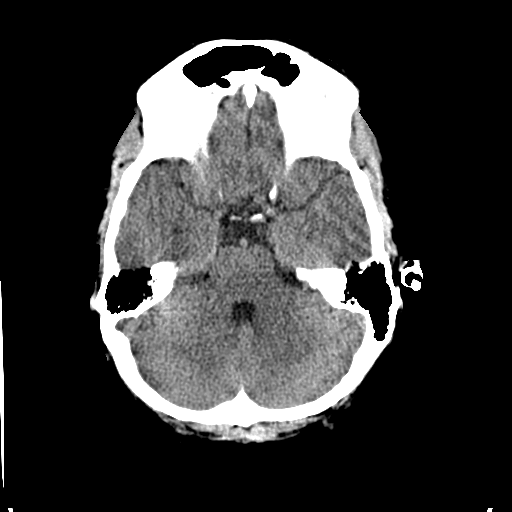
[im 10/36  brain]
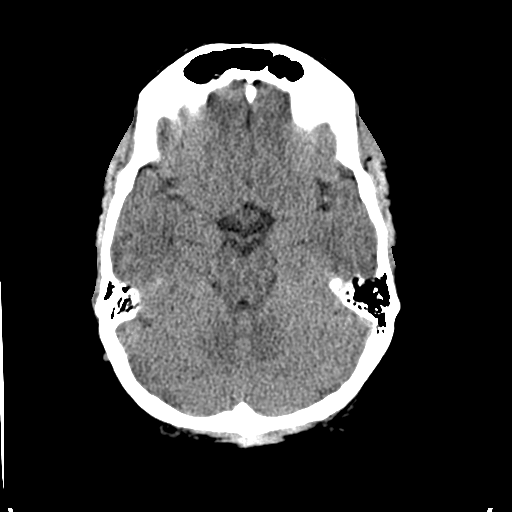
[im 10/36  bone]
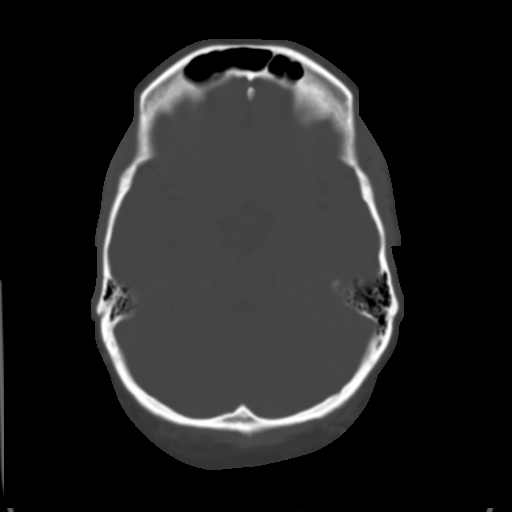
[im 13/36  brain]
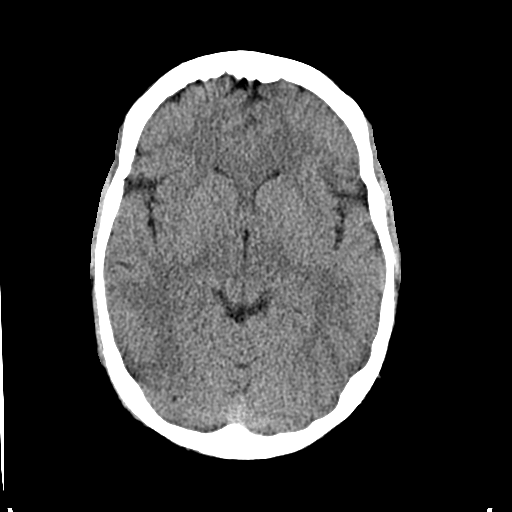
[im 15/36  brain]
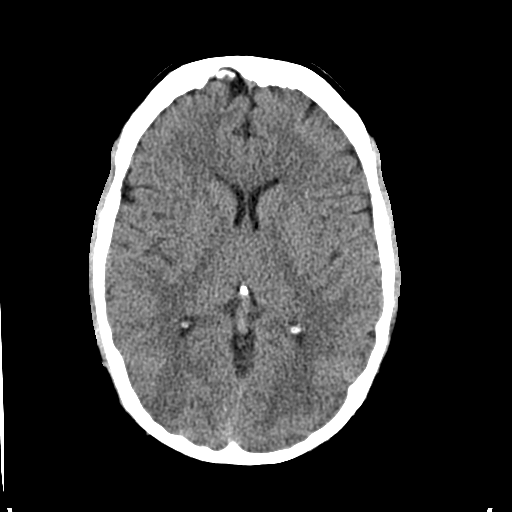
[im 17/36  brain]
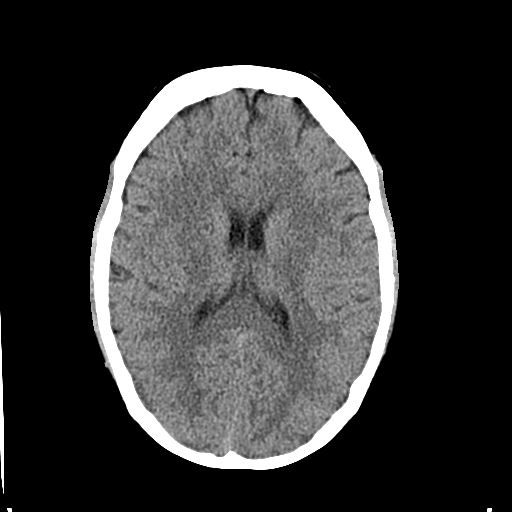
[im 19/36  brain]
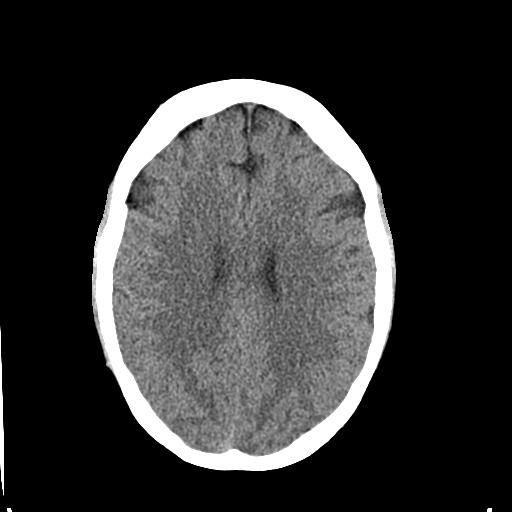
[im 19/36  bone]
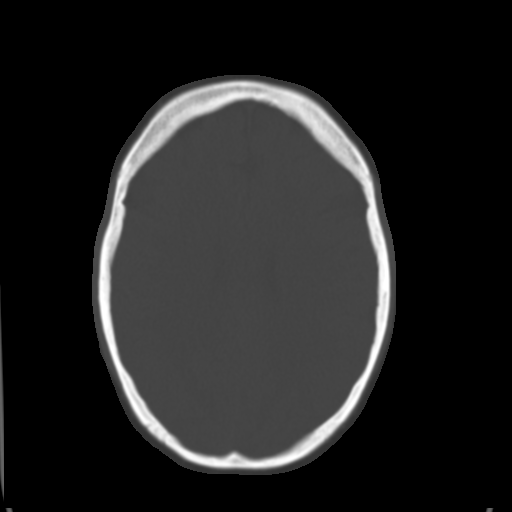
[im 21/36  brain]
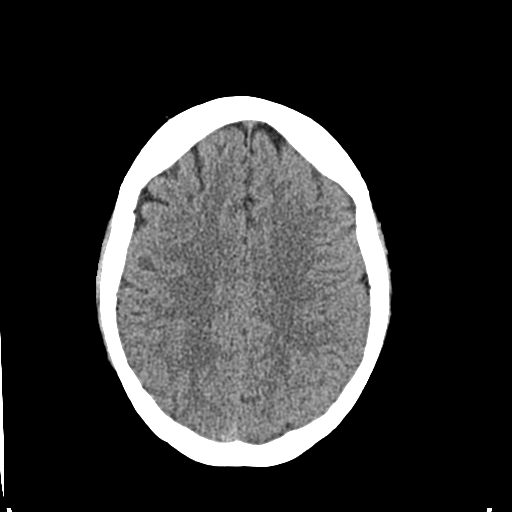
[im 23/36  brain]
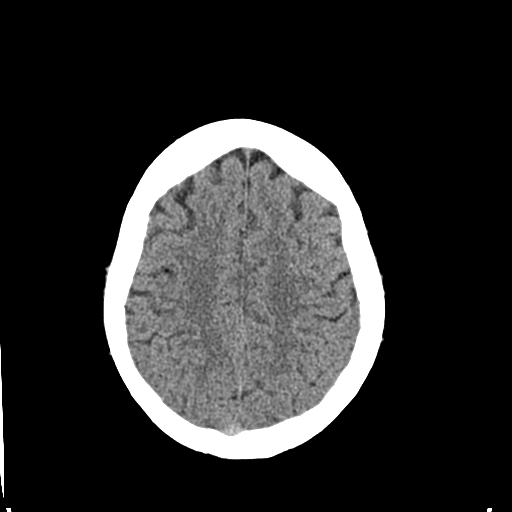
[im 26/36  brain]
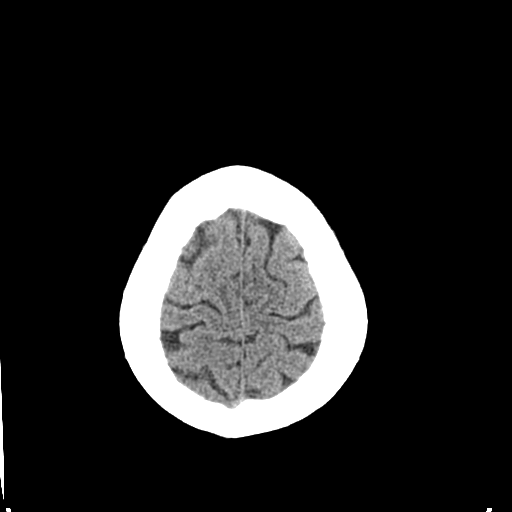
[im 27/36  brain]
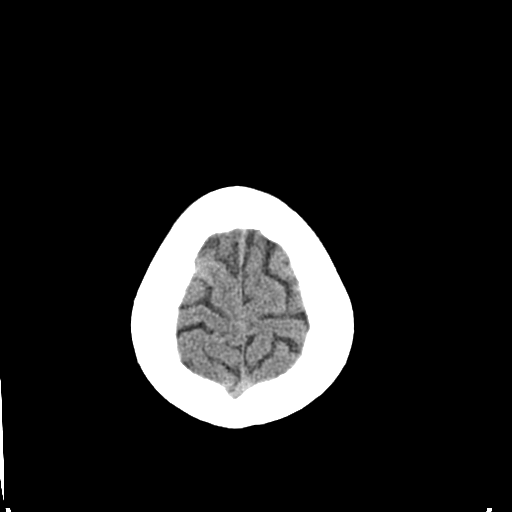
[im 27/36  bone]
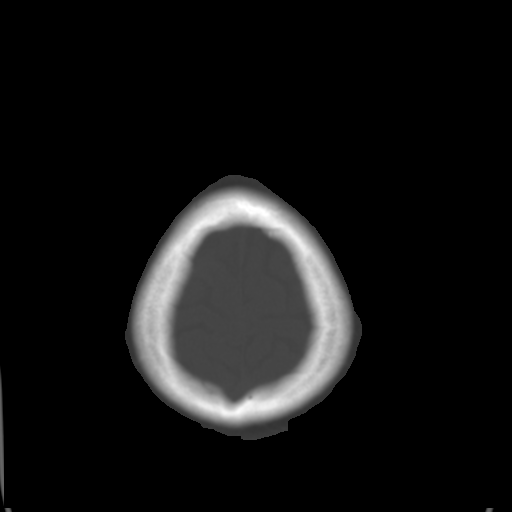
[im 29/36  brain]
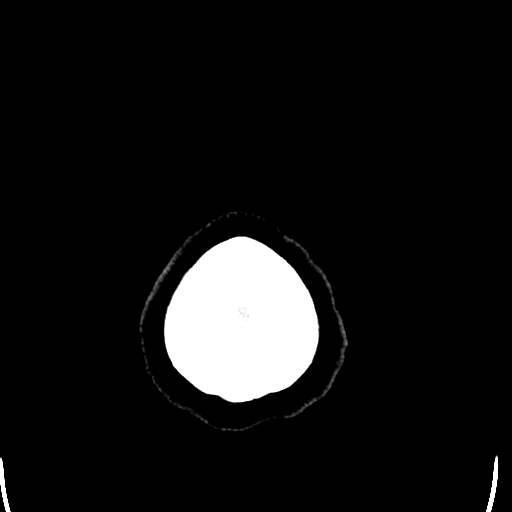
[im 32/36  brain]
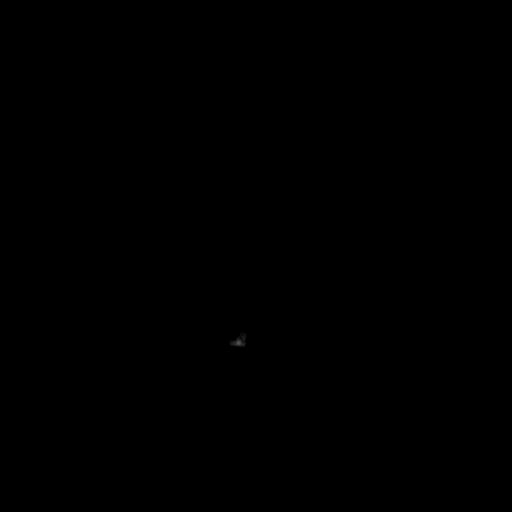
[im 34/36  brain]
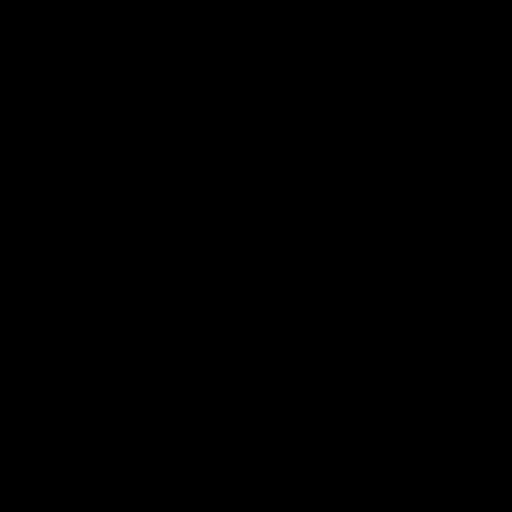

[16 of 30 positions shown; findings below may reference images not displayed]

FINDINGS: Visualized orbits and scalp soft tissues are within
normal limits.  Visualized paranasal sinuses and mastoids are
clear.  No acute osseous abnormality identified.

Cerebral volume is within normal limits for age.  No midline shift,
ventriculomegaly, mass effect, evidence of mass lesion,
intracranial hemorrhage or evidence of cortically based acute
infarction.  Gray-white matter differentiation is within normal
limits throughout the brain.  No suspicious intracranial vascular
hyperdensity.
IMPRESSION: Normal noncontrast CT appearance of the brain.

## 2018-11-15 ENCOUNTER — Other Ambulatory Visit: Payer: Self-pay

## 2018-11-15 DIAGNOSIS — Z20822 Contact with and (suspected) exposure to covid-19: Secondary | ICD-10-CM

## 2018-11-16 LAB — NOVEL CORONAVIRUS, NAA: SARS-CoV-2, NAA: DETECTED — AB

## 2018-11-17 ENCOUNTER — Telehealth: Payer: Self-pay

## 2018-11-17 NOTE — Telephone Encounter (Signed)
Patient called in to confirm Positive COVID19 lab results - DOB/Address verified - confirmed positive results - reviewed patient positive test results with patient.  Patient reports the following symptoms: Head ache, loss of smell/taste and nasal congestion. Reviewed MyChart Companion Algorithm for Home Monitoring of [place] with patient. Patient verbalized understanding, no further questions.

## 2019-08-24 ENCOUNTER — Other Ambulatory Visit: Payer: Self-pay

## 2019-08-24 ENCOUNTER — Other Ambulatory Visit: Payer: Self-pay | Admitting: Cardiology

## 2019-08-24 DIAGNOSIS — Z20822 Contact with and (suspected) exposure to covid-19: Secondary | ICD-10-CM

## 2019-08-26 LAB — SARS-COV-2, NAA 2 DAY TAT

## 2019-08-26 LAB — NOVEL CORONAVIRUS, NAA: SARS-CoV-2, NAA: NOT DETECTED
